# Patient Record
Sex: Male | Born: 1975 | Race: White | Hispanic: No | Marital: Single
Health system: Midwestern US, Community
[De-identification: ages and names within clinical notes are randomized; demographics above are authoritative.]

## PROBLEM LIST (undated history)

## (undated) ENCOUNTER — Emergency Department (HOSPITAL_COMMUNITY): Payer: Self-pay

## (undated) DIAGNOSIS — F1910 Other psychoactive substance abuse, uncomplicated: Secondary | ICD-10-CM

## (undated) DIAGNOSIS — F1010 Alcohol abuse, uncomplicated: Secondary | ICD-10-CM

## (undated) DIAGNOSIS — F32A Depression, unspecified: Secondary | ICD-10-CM

## (undated) DIAGNOSIS — E119 Type 2 diabetes mellitus without complications: Secondary | ICD-10-CM

## (undated) DIAGNOSIS — E109 Type 1 diabetes mellitus without complications: Secondary | ICD-10-CM

## (undated) DIAGNOSIS — F329 Major depressive disorder, single episode, unspecified: Secondary | ICD-10-CM

## (undated) HISTORY — PX: CLAVICLE SURGERY: SHX598

---

## 2010-04-02 MED ORDER — TRIMETHOPRIM-SULFAMETHOXAZOLE 160 MG-800 MG TAB
160-800 mg | ORAL_TABLET | Freq: Two times a day (BID) | ORAL | Status: AC
Start: 2010-04-02 — End: 2010-04-16

## 2010-04-02 MED ORDER — TRAMADOL 50 MG TAB
50 mg | ORAL_TABLET | Freq: Four times a day (QID) | ORAL | Status: DC | PRN
Start: 2010-04-02 — End: 2010-05-14

## 2010-04-02 MED ORDER — CHLORHEXIDINE GLUCONATE 4 % TOPICAL LIQUID
4 % | Freq: Two times a day (BID) | CUTANEOUS | Status: AC | PRN
Start: 2010-04-02 — End: 2010-04-16

## 2010-04-02 NOTE — ED Notes (Signed)
I have reviewed discharge instructions with the patient.  The patient verbalized understanding.      Pt given RX for following medications at discharge   New prescriptions   Medication Sig Dispense Refill   ??? insulin glargine (LANTUS) 100 unit/mL injection 35 Units by SubCUTAneous route nightly.         ??? insulin aspart (NOVOLOG) 100 unit/mL injection by SubCUTAneous route Before breakfast, lunch, and dinner. Sliding scale        ??? trimethoprim-sulfamethoxazole (BACTRIM DS) 160-800 mg per tablet Take 1 Tab by mouth two (2) times a day for 14 days.  28 Tab  0   ??? chlorhexidine (HIBICLENS) 4 % liquid Apply 1 mL to affected area two (2) times daily as needed for 14 days.  480 mL  0   ??? traMADol (ULTRAM) 50 mg tablet Take 1 Tab by mouth every six (6) hours as needed for Pain.  30 Tab  0   .  Pt discharged ambulatory from emergency department in no acute distress.

## 2010-04-02 NOTE — ED Provider Notes (Signed)
HPI Comments: Pt is here today with multiple small abscesses. He is staying at a rehab facility [facility] [address] and thinks that the bunk he is sleeping in has been contaminated with MRSA. He states he has had staph infection before though. He is not having a fever and does have a little bit of pain in those area's. They are draining a little.    Patient is a 35 y.o. male presenting with abscess. The history is provided by the patient.   Abscess   This is a new problem. The problem has been gradually worsening. Associated with: pt staying in a rehab facility. There has been no fever. Affected Location: left side of neck, bilateral axilla's, scalp. The pain is at a severity of 5/10. The pain is moderate. The pain has been constant since onset. Associated symptoms include pain. Pertinent negatives include no blisters, no itching, no weeping and no hives. Risk factors include new environmental exposures. He has tried nothing for the symptoms.        Past Medical History   Diagnosis Date   ??? Diabetes    ??? Other ill-defined conditions      45 broken bones        Past Surgical History   Procedure Date   ??? Hx orthopaedic      left ankle, left foot, right knee, 4 fingers right hand, both sides clavical   ??? Hx heent      eye         No family history on file.     History     Social History   ??? Marital Status: Single     Spouse Name: N/A     Number of Children: N/A   ??? Years of Education: N/A     Occupational History   ??? Not on file.     Social History Main Topics   ??? Smoking status: Former Smoker     Quit date: 02/01/2010   ??? Smokeless tobacco: Not on file   ??? Alcohol Use: No   ??? Drug Use: No   ??? Sexually Active:      Other Topics Concern   ??? Not on file     Social History Narrative   ??? No narrative on file                  ALLERGIES: Review of patient's allergies indicates no known allergies.      Review of Systems   Constitutional: Negative.    HENT: Negative.    Eyes: Negative.    Respiratory: Negative.     Cardiovascular: Negative.    Gastrointestinal: Negative.    Genitourinary: Negative.    Musculoskeletal: Negative.    Skin: Positive for wound. Negative for itching.   Neurological: Negative.    Hematological: Negative.    Psychiatric/Behavioral: Negative.    [all other systems reviewed and are negative        Filed Vitals:    04/02/10 1453   BP: 144/84   Pulse: 86   Temp: 98.4 ??F (36.9 ??C)   Resp: 18   Height: 5\' 7"  (1.702 m)   Weight: 175 lb (79.379 kg)   SpO2: 99%            Physical Exam   [nursing notereviewed.  Constitutional: He is oriented to person, place, and time. He appears well-developed and well-nourished.   HENT:   Head: Normocephalic and atraumatic.   Right Ear: External ear normal.   Left Ear:  External ear normal.   Nose: Nose normal.   Mouth/Throat: Oropharynx is clear and moist.   Eyes: Conjunctivae and EOM are normal. Pupils are equal, round, and reactive to light.   Neck: Normal range of motion. Neck supple.   Cardiovascular: Normal rate, regular rhythm, normal heart sounds and intact distal pulses.    Pulmonary/Chest: Effort normal and breath sounds normal.   Abdominal: Soft. Bowel sounds are normal.   Musculoskeletal: Normal range of motion.   Neurological: He is alert and oriented to person, place, and time. He has normal reflexes.   Skin: Skin is warm and dry. There is erythema.        Psychiatric: He has a normal mood and affect. His behavior is normal. Judgment and thought content normal.        MDM    Procedures    The patient was observed in the ED.    I discussed the results of all labs, procedures, radiographs, and treatments with the patient and available family.  Treatment plan is agreed upon and the patient is ready for discharge.  All voiced understanding of the discharge plan and medication instructions or changes as appropriate.  Questions about treatment in the ED were answered.  All were encouraged to return should symptoms worsen or new problems develop.

## 2010-05-14 NOTE — ED Notes (Signed)
Pt with multiple abscesses to face and neck states he has been on antibiotic for one month but is developing new wounds.

## 2010-05-14 NOTE — ED Provider Notes (Signed)
HPI Comments: Pt with recurrent abscesses to face.    Patient is a 35 y.o. male presenting with skin problem. The history is provided by the patient.   Skin Problem   This is a recurrent problem. The current episode started yesterday. The problem has been gradually worsening. Patient reports a subjective fever - was not measured.The fever has been present for less than 1 day. The rash is present on the face. The pain is moderate. The pain has been constant since onset. Associated symptoms include pain. Pertinent negatives include no weeping. Treatments tried: on bactrim. The treatment provided no relief.        Past Medical History   Diagnosis Date   ??? Diabetes    ??? Other ill-defined conditions      45 broken bones        Past Surgical History   Procedure Date   ??? Hx orthopaedic      left ankle, left foot, right knee, 4 fingers right hand, both sides clavical   ??? Hx heent      eye         No family history on file.     History     Social History   ??? Marital Status: Single     Spouse Name: N/A     Number of Children: N/A   ??? Years of Education: N/A     Occupational History   ??? Not on file.     Social History Main Topics   ??? Smoking status: Former Smoker     Quit date: 02/01/2010   ??? Smokeless tobacco: Not on file   ??? Alcohol Use: No   ??? Drug Use: No   ??? Sexually Active:      Other Topics Concern   ??? Not on file     Social History Narrative   ??? No narrative on file                  ALLERGIES: Review of patient's allergies indicates no known allergies.      Review of Systems   Constitutional: Positive for fever.   HENT: Positive for facial swelling.    Respiratory: Negative.    Cardiovascular: Negative.    Gastrointestinal: Negative.    Skin: Positive for rash.       Filed Vitals:    05/14/10 1817   BP: 139/82   Pulse: 79   Temp: 97.8 ??F (36.6 ??C)   Resp: 18   Height: 5\' 9"  (1.753 m)   Weight: 185 lb (83.915 kg)   SpO2: 99%            Physical Exam   Constitutional: He appears well-developed and well-nourished.    HENT:   Head: Normocephalic.   Neck: Normal range of motion. Neck supple.   Skin: Skin is warm and dry. Lesion noted.             MDM     Differential Diagnosis; Clinical Impression; Plan:     [Multiple abscess.  Pt with nl BS at home.  Nl VS.  I&D of 2 abscesses done.  Pt gave verbal consent.  Told there would be a scar.  Tolerated well.  Dx Abscess  Plan clindamycin  Amount and/or Complexity of Data Reviewed:    Review and summarize past medical records:  Yes  Risk of Significant Complications, Morbidity, and/or Mortality:   Presenting problems:  [moderate  Diagnostic procedures:  [moderate  Management options:  [employer]  I&D Abcess Simple  Consent: Verbal consent obtained.  Consent given by: patient  Date/Time: 05/14/2010 10:43 PM  Performed by: attendingPreparation: skin prepped with Betadine  Pre-procedure re-eval: Immediately prior to the procedure, the patient was reevaluated and found suitable for the planned procedure and any planned medications.  Location details: face  Anesthesia: local infiltration  Local anesthetic: lidocaine 1% with epinephrine  Anesthetic total: 3 ml  Scalpel size: 11  Incision type: single straight  Complexity: simple  Drainage: purulent  Drainage amount: moderate  Wound treatment: wound left open and drain placed  Packing material: 1/4 in iodoform gauze  Post-procedure: dressing applied  Patient tolerance: Patient tolerated the procedure well with no immediate complications.  My total time at bedside, performing this procedure was 1-15 minutes.

## 2010-05-15 MED ORDER — CLINDAMYCIN 300 MG CAP
300 mg | ORAL_CAPSULE | Freq: Four times a day (QID) | ORAL | Status: AC
Start: 2010-05-15 — End: 2010-05-21

## 2010-05-15 MED ORDER — NAPROXEN 500 MG TAB
500 mg | ORAL_TABLET | Freq: Two times a day (BID) | ORAL | Status: AC
Start: 2010-05-15 — End: ?

## 2010-05-15 MED ORDER — LIDOCAINE-EPINEPHRINE 1 %-1:100,000 IJ SOLN
1 %-:00,000 | INTRAMUSCULAR | Status: DC
Start: 2010-05-15 — End: 2010-05-15
  Administered 2010-05-15: 03:00:00 via INTRADERMAL

## 2017-11-16 ENCOUNTER — Other Ambulatory Visit: Payer: Self-pay

## 2017-11-16 ENCOUNTER — Inpatient Hospital Stay (HOSPITAL_COMMUNITY)
Admission: EM | Admit: 2017-11-16 | Discharge: 2017-11-18 | DRG: 638 | Disposition: A | Discharge status: Home / Self Care | Payer: Self-pay | Attending: Internal Medicine | Admitting: Internal Medicine

## 2017-11-16 ENCOUNTER — Encounter (HOSPITAL_COMMUNITY): Payer: Self-pay

## 2017-11-16 ENCOUNTER — Emergency Department (HOSPITAL_COMMUNITY): Payer: Self-pay

## 2017-11-16 DIAGNOSIS — E1110 Type 2 diabetes mellitus with ketoacidosis without coma: Principal | ICD-10-CM

## 2017-11-16 DIAGNOSIS — Z794 Long term (current) use of insulin: Secondary | ICD-10-CM

## 2017-11-16 DIAGNOSIS — B349 Viral infection, unspecified: Secondary | ICD-10-CM | POA: Diagnosis present

## 2017-11-16 DIAGNOSIS — E785 Hyperlipidemia, unspecified: Secondary | ICD-10-CM | POA: Diagnosis present

## 2017-11-16 DIAGNOSIS — E1169 Type 2 diabetes mellitus with other specified complication: Secondary | ICD-10-CM | POA: Diagnosis present

## 2017-11-16 DIAGNOSIS — N289 Disorder of kidney and ureter, unspecified: Secondary | ICD-10-CM

## 2017-11-16 DIAGNOSIS — I10 Essential (primary) hypertension: Secondary | ICD-10-CM | POA: Diagnosis present

## 2017-11-16 DIAGNOSIS — M549 Dorsalgia, unspecified: Secondary | ICD-10-CM | POA: Diagnosis present

## 2017-11-16 DIAGNOSIS — F99 Mental disorder, not otherwise specified: Secondary | ICD-10-CM

## 2017-11-16 DIAGNOSIS — R05 Cough: Secondary | ICD-10-CM | POA: Diagnosis present

## 2017-11-16 DIAGNOSIS — E875 Hyperkalemia: Secondary | ICD-10-CM

## 2017-11-16 DIAGNOSIS — E1310 Other specified diabetes mellitus with ketoacidosis without coma: Secondary | ICD-10-CM

## 2017-11-16 DIAGNOSIS — E876 Hypokalemia: Secondary | ICD-10-CM | POA: Diagnosis present

## 2017-11-16 DIAGNOSIS — R059 Cough, unspecified: Secondary | ICD-10-CM | POA: Diagnosis present

## 2017-11-16 DIAGNOSIS — N179 Acute kidney failure, unspecified: Secondary | ICD-10-CM | POA: Diagnosis present

## 2017-11-16 DIAGNOSIS — Z23 Encounter for immunization: Secondary | ICD-10-CM

## 2017-11-16 DIAGNOSIS — E871 Hypo-osmolality and hyponatremia: Secondary | ICD-10-CM | POA: Diagnosis present

## 2017-11-16 HISTORY — DX: Type 2 diabetes mellitus without complications: E11.9

## 2017-11-16 LAB — CBC
HCT: 33.4 % — ABNORMAL LOW (ref 39.0–52.0)
Hemoglobin: 10.8 g/dL — ABNORMAL LOW (ref 13.0–17.0)
MCH: 26.9 pg (ref 26.0–34.0)
MCHC: 32.3 g/dL (ref 30.0–36.0)
MCV: 83.3 fL (ref 80.0–100.0)
NRBC: 0 % (ref 0.0–0.2)
PLATELETS: 538 10*3/uL — AB (ref 150–400)
RBC: 4.01 MIL/uL — ABNORMAL LOW (ref 4.22–5.81)
RDW: 14.2 % (ref 11.5–15.5)
WBC: 13.3 10*3/uL — ABNORMAL HIGH (ref 4.0–10.5)

## 2017-11-16 LAB — TROPONIN I: Troponin I: <0.03 ng/mL (ref <0.03)

## 2017-11-16 LAB — COMPREHENSIVE METABOLIC PANEL
ALT: 21 U/L (ref 0–44)
AST: 22 U/L (ref 15–41)
Albumin: 3.6 g/dL (ref 3.5–5.0)
Alkaline Phosphatase: 138 U/L — ABNORMAL HIGH (ref 38–126)
Anion gap: 19 — ABNORMAL HIGH (ref 5–15)
BUN: 39 mg/dL — ABNORMAL HIGH (ref 6–20)
CALCIUM: 8.9 mg/dL (ref 8.9–10.3)
CO2: 19 mmol/L — AB (ref 22–32)
Chloride: 81 mmol/L — ABNORMAL LOW (ref 98–111)
Creatinine, Ser: 1.46 mg/dL — ABNORMAL HIGH (ref 0.61–1.24)
GFR calc non Af Amer: 57 mL/min — ABNORMAL LOW (ref >60)
GLUCOSE: 911 mg/dL — AB (ref 70–99)
Potassium: 6.9 mmol/L (ref 3.5–5.1)
SODIUM: 119 mmol/L — AB (ref 135–145)
Total Bilirubin: 1.1 mg/dL (ref 0.3–1.2)
Total Protein: 7.7 g/dL (ref 6.5–8.1)

## 2017-11-16 LAB — URINALYSIS, ROUTINE W REFLEX MICROSCOPIC
BACTERIA UA: NONE SEEN
BILIRUBIN URINE: NEGATIVE
HGB URINE DIPSTICK: NEGATIVE
Ketones, ur: 20 mg/dL — AB
Leukocytes, UA: NEGATIVE
Nitrite: NEGATIVE
PROTEIN: NEGATIVE mg/dL
SPECIFIC GRAVITY, URINE: 1.02 (ref 1.005–1.030)
pH: 6 (ref 5.0–8.0)

## 2017-11-16 LAB — BLOOD GAS, VENOUS
Acid-base deficit: 3.1 mmol/L — ABNORMAL HIGH (ref 0.0–2.0)
BICARBONATE: 19.6 mmol/L — AB (ref 20.0–28.0)
FIO2: 21
O2 Saturation: 66 %
PH VEN: 7.451 — AB (ref 7.250–7.430)
PO2 VEN: 35.6 mmHg (ref 32.0–45.0)
Patient temperature: 98.6
pCO2, Ven: 28.5 mmHg — ABNORMAL LOW (ref 44.0–60.0)

## 2017-11-16 LAB — LIPASE, BLOOD: Lipase: 17 U/L (ref 11–51)

## 2017-11-16 LAB — CBG MONITORING, ED: Glucose-Capillary: >600 mg/dL (ref 70–99)

## 2017-11-16 MED ORDER — FOLIC ACID 1 MG PO TABS
1.0000 mg | ORAL_TABLET | Freq: Every day | ORAL | Status: DC
  Administered 2017-11-17 – 2017-11-18 (×2): 1 mg via ORAL
  Filled 2017-11-16 (×2): qty 1

## 2017-11-16 MED ORDER — TRAZODONE HCL 100 MG PO TABS
200.0000 mg | ORAL_TABLET | Freq: Every day | ORAL | Status: DC
  Administered 2017-11-16 – 2017-11-17 (×2): 200 mg via ORAL
  Filled 2017-11-16 (×2): qty 2

## 2017-11-16 MED ORDER — SODIUM CHLORIDE 0.9 % IV SOLN
INTRAVENOUS | Status: DC
  Administered 2017-11-16 – 2017-11-17 (×2): via INTRAVENOUS

## 2017-11-16 MED ORDER — SODIUM CHLORIDE 0.9 % IV BOLUS
1000.0000 mL | Freq: Once | INTRAVENOUS | Status: AC
  Administered 2017-11-16: 1000 mL via INTRAVENOUS

## 2017-11-16 MED ORDER — GUAIFENESIN-DM 100-10 MG/5ML PO SYRP
5.0000 mL | ORAL_SOLUTION | ORAL | Status: DC | PRN
  Administered 2017-11-16 – 2017-11-18 (×7): 5 mL via ORAL
  Filled 2017-11-16 (×7): qty 10

## 2017-11-16 MED ORDER — DEXTROSE-NACL 5-0.45 % IV SOLN
INTRAVENOUS | Status: DC
  Administered 2017-11-17: 05:00:00 via INTRAVENOUS

## 2017-11-16 MED ORDER — RISPERIDONE 1 MG PO TABS
3.0000 mg | ORAL_TABLET | Freq: Two times a day (BID) | ORAL | Status: DC
  Administered 2017-11-16 – 2017-11-18 (×4): 3 mg via ORAL
  Filled 2017-11-16 (×2): qty 1
  Filled 2017-11-16: qty 3
  Filled 2017-11-16: qty 1

## 2017-11-16 MED ORDER — SODIUM CHLORIDE 0.9 % IV SOLN
INTRAVENOUS | Status: AC
  Administered 2017-11-16: via INTRAVENOUS

## 2017-11-16 MED ORDER — INSULIN REGULAR(HUMAN) IN NACL 100-0.9 UT/100ML-% IV SOLN
INTRAVENOUS | Status: DC
  Administered 2017-11-16: 5.4 [IU] via INTRAVENOUS

## 2017-11-16 MED ORDER — GABAPENTIN 300 MG PO CAPS
300.0000 mg | ORAL_CAPSULE | Freq: Two times a day (BID) | ORAL | Status: DC
  Administered 2017-11-16 – 2017-11-18 (×4): 300 mg via ORAL
  Filled 2017-11-16 (×4): qty 1

## 2017-11-16 MED ORDER — SODIUM CHLORIDE 0.9 % IV SOLN: INTRAVENOUS | Status: DC

## 2017-11-16 MED ORDER — INSULIN REGULAR(HUMAN) IN NACL 100-0.9 UT/100ML-% IV SOLN
INTRAVENOUS | Status: DC
  Filled 2017-11-16: qty 100

## 2017-11-16 MED ORDER — PRAVASTATIN SODIUM 20 MG PO TABS
40.0000 mg | ORAL_TABLET | Freq: Every day | ORAL | Status: DC
  Administered 2017-11-16 – 2017-11-17 (×2): 40 mg via ORAL
  Filled 2017-11-16 (×2): qty 2

## 2017-11-16 MED ORDER — TRAMADOL HCL 50 MG PO TABS
50.0000 mg | ORAL_TABLET | Freq: Four times a day (QID) | ORAL | Status: DC | PRN
  Administered 2017-11-17 – 2017-11-18 (×4): 50 mg via ORAL
  Filled 2017-11-16 (×4): qty 1

## 2017-11-16 MED ORDER — PNEUMOCOCCAL VAC POLYVALENT 25 MCG/0.5ML IJ INJ
0.5000 mL | INJECTION | INTRAMUSCULAR | Status: AC
  Administered 2017-11-17: 0.5 mL via INTRAMUSCULAR
  Filled 2017-11-16: qty 0.5

## 2017-11-16 MED ORDER — SODIUM CHLORIDE 0.9 % IV BOLUS: 1000.0000 mL | Freq: Once | INTRAVENOUS | Status: DC

## 2017-11-16 MED ORDER — ONDANSETRON HCL 4 MG/2ML IJ SOLN
4.0000 mg | Freq: Four times a day (QID) | INTRAMUSCULAR | Status: DC | PRN
  Administered 2017-11-16: 4 mg via INTRAVENOUS
  Filled 2017-11-16: qty 2

## 2017-11-16 MED ORDER — BENZTROPINE MESYLATE 0.5 MG PO TABS
1.0000 mg | ORAL_TABLET | Freq: Two times a day (BID) | ORAL | Status: DC
  Administered 2017-11-16 – 2017-11-18 (×4): 1 mg via ORAL
  Filled 2017-11-16: qty 1
  Filled 2017-11-16: qty 2
  Filled 2017-11-16 (×2): qty 1
  Filled 2017-11-16: qty 2

## 2017-11-16 NOTE — H&P (Signed)
History and Physical   [facility] [place] [facility] Admission History and Physical [employer], D.O.    Patient Name: Connor Bailey MR#: [id number] Date of Birth: 1975-11-15 Date of Admission: 11/16/2017  Referring MD/NP/PA: Dr. Rosalia Hammers Primary Care Physician: Patient, No Pcp Per  Chief Complaint:  Chief Complaint  Patient presents with  . Hyperglycemia  . Emesis    HPI: Connor Bailey is a 42 y.o. male with a known history of IDDM presents to the emergency department for evaluation of flulike symptoms.  Patient was in a usual state of health until proximally 1 week ago when he developed symptoms of upper respiratory infection such as nasal congestion and cough, sneezing which was nonproductive.  He reports several of his new roommates have similar symptoms.  He noted his blood sugars to be elevated in the 400 - 600 range over the past 2 days.  Patient denies weakness, dizziness, chest pain, shortness of breath, N/V/C/D, abdominal pain, dysuria/frequency, changes in mental status.    Otherwise there has been no change in status. Patient has been taking medication as prescribed and there has been no recent change in medication or diet.  No recent antibiotics.  There has been no recent illness, hospitalizations, travel..    Of note patient moved to this area from [place] approximately 1 week ago and has not yet established medical care.  EMS/ED Course: Patient received insulin via glucose stabilizer, normal saline. Medical admission has been requested for further management of DKA.  Review of Systems:  CONSTITUTIONAL: No fever/chills, fatigue, weakness, weight gain/loss, headache. EYES: No blurry or double vision. ENT: No tinnitus, postnasal drip, redness or soreness of the oropharynx. Positive sneezing, congestion. RESPIRATORY: Positive cough, No dyspnea, wheeze.  No hemoptysis.  CARDIOVASCULAR: No chest pain, palpitations, syncope, orthopnea. No lower extremity  edema.  GASTROINTESTINAL: No nausea, vomiting, abdominal pain, diarrhea, constipation.  No hematemesis, melena or hematochezia. GENITOURINARY: No dysuria, frequency, hematuria. ENDOCRINE: No polyuria or nocturia. No heat or cold intolerance. HEMATOLOGY: No anemia, bruising, bleeding. INTEGUMENTARY: No rashes, ulcers, lesions. MUSCULOSKELETAL: No arthritis, gout, dyspnea. NEUROLOGIC: No numbness, tingling, ataxia, seizure-type activity, weakness. PSYCHIATRIC: No anxiety, depression, insomnia.   Past Medical History:  Diagnosis Date  . Diabetes mellitus without complication (HCC)     History reviewed. No pertinent surgical history.   reports that he has never smoked. He has never used smokeless tobacco. He reports that he does not drink alcohol or use drugs.  No Known Allergies  History reviewed. No pertinent family history.  Prior to Admission medications   Medication Sig Start Date End Date Taking? Authorizing Provider  benztropine (COGENTIN) 1 MG tablet Take 1 mg by mouth 2 (two) times daily.  11/14/17  Yes [provider]  folic acid (FOLVITE) 1 MG tablet Take 1 mg by mouth daily. 10/22/17  Yes [provider]  gabapentin (NEURONTIN) 300 MG capsule Take 300 mg by mouth 2 (two) times daily.  11/14/17  Yes [provider]  insulin aspart (NOVOLOG) 100 UNIT/ML injection Inject 0-10 Units into the skin 3 (three) times daily before meals.   Yes [provider]  insulin glargine (LANTUS) 100 UNIT/ML injection Inject 39 Units into the skin at bedtime.   Yes [provider]  pravastatin (PRAVACHOL) 40 MG tablet Take 40 mg by mouth at bedtime.  11/14/17  Yes [provider]  risperiDONE (RISPERDAL) 3 MG tablet Take 3 mg by mouth 2 (two) times daily.  11/14/17  Yes [provider]  traMADol (ULTRAM) 50 MG tablet Take 50 mg by mouth every 6 (six) hours as needed for moderate pain or severe pain.  10/18/17  Yes [provider]  traZODone (DESYREL) 100 MG tablet Take 200 mg by mouth at bedtime.  11/14/17  Yes [provider]    Physical Exam: Vitals:   11/16/17 1852 11/16/17 1939 11/16/17 2109 11/16/17 2155  BP: (!) 160/72 (!) 161/76 (!) 155/66 (!) 172/99  Pulse: 83 82 78 79  Resp: 18 17 17  (!) 25  Temp: 97.8 F (36.6 C) 99.8 F (37.7 C)    TempSrc: Oral Rectal    SpO2: 95% 93% 96% 91%    GENERAL: 42 y.o.-year-old male patient, well-developed, well-nourished lying in the bed in no acute distress.  Pleasant and cooperative.   HEENT: Head atraumatic, normocephalic. Pupils equal. Mucus membranes moist.  Deformity of the cartilage of the right ear.  NECK: Supple. No JVD. CHEST: Normal breath sounds bilaterally. No wheezing, rales, rhonchi or crackles. No use of accessory muscles of respiration.  No reproducible chest wall tenderness.  CARDIOVASCULAR: S1, S2 normal. No murmurs, rubs, or gallops. Cap refill <2 seconds. Pulses intact distally.  ABDOMEN: Soft, nondistended, nontender. No rebound, guarding, rigidity. Normoactive bowel sounds present in all four quadrants.  EXTREMITIES: No pedal edema, cyanosis, or clubbing. No calf tenderness or Homan's sign.  NEUROLOGIC: The patient is alert and oriented x 3. Cranial nerves II through XII are grossly intact with no focal sensorimotor deficit. PSYCHIATRIC:  Normal affect, mood, thought content. SKIN: Warm, dry, and intact without obvious rash, lesion, or ulcer.    Labs on Admission:  CBC: Recent Labs  Lab 11/16/17 1858  WBC 13.3*  HGB 10.8*  HCT 33.4*  MCV 83.3  PLT 538*   Basic Metabolic Panel: Recent Labs  Lab 11/16/17 1858  NA 119*  K 6.9*  CL 81*  CO2 19*  GLUCOSE 911*  BUN 39*  CREATININE 1.46*  CALCIUM 8.9   GFR: CrCl cannot be calculated (Unknown ideal weight.). Liver Function Tests: Recent Labs  Lab 11/16/17 1858  AST 22  ALT 21  ALKPHOS 138*  BILITOT 1.1  PROT 7.7  ALBUMIN 3.6   Recent Labs  Lab 11/16/17 1858   LIPASE 17   No results for input(s): AMMONIA in the last 168 hours. Coagulation Profile: No results for input(s): INR, PROTIME in the last 168 hours. Cardiac Enzymes: Recent Labs  Lab 11/16/17 2031  TROPONINI <0.03   BNP (last 3 results) No results for input(s): PROBNP in the last 8760 hours. HbA1C: No results for input(s): HGBA1C in the last 72 hours. CBG: Recent Labs  Lab 11/16/17 1858  GLUCAP >600*   Lipid Profile: No results for input(s): CHOL, HDL, LDLCALC, TRIG, CHOLHDL, LDLDIRECT in the last 72 hours. Thyroid Function Tests: No results for input(s): TSH, T4TOTAL, FREET4, T3FREE, THYROIDAB in the last 72 hours. Anemia Panel: No results for input(s): VITAMINB12, FOLATE, FERRITIN, TIBC, IRON, RETICCTPCT in the last 72 hours. Urine analysis:    Component Value Date/Time   COLORURINE STRAW (A) 11/16/2017 1937   APPEARANCEUR CLEAR 11/16/2017 1937   LABSPEC 1.020 11/16/2017 1937   PHURINE 6.0 11/16/2017 1937   GLUCOSEU >=500 (A) 11/16/2017 1937   HGBUR NEGATIVE 11/16/2017 1937   BILIRUBINUR NEGATIVE 11/16/2017 1937   KETONESUR 20 (A) 11/16/2017 1937   PROTEINUR NEGATIVE 11/16/2017 1937   NITRITE NEGATIVE 11/16/2017 1937   LEUKOCYTESUR NEGATIVE 11/16/2017 1937   Sepsis Labs: @LABRCNTIP (procalcitonin:4,lacticidven:4) )No results found for this or  any previous visit (from the past 240 hour(s)).   Radiological Exams on Admission: Dg Chest Port 1 View  Result Date: 11/16/2017 CLINICAL DATA:  Cough EXAM: PORTABLE CHEST 1 VIEW COMPARISON:  None. FINDINGS: Heart is borderline in size. Mild peribronchial thickening. No confluent opacities, effusions or edema. No acute bony abnormality. IMPRESSION: Heart upper limits normal in size.  Mild bronchitic changes. Electronically Signed   By: Charlett Nose M.D.   On: 11/16/2017 20:33    EKG: Normal sinus rhythm at 84 bpm with normal axis and nonspecific ST-T wave changes.   Assessment/Plan  This is a 42 y.o. male with a  history of insulin dependent diabetes now being admitted with:  #. #. Diabetic ketoacidosis with associated electrolyte abnormalities - hyponatremia and hyperkalemia -Admit to stepdown -IV insulin drip  -IV fluid hydration -NPO -BMP every 4 hours  -Check beta-hydroxybutyrate, VBG. -Replace electrolytes as needed  -Follow up cultures -Check fluswab   Admission status: Inpatient stepdown IV Fluids: NS Diet/Nutrition: NPO Consults called: None  DVT Px: SCDs and early ambulation. Code Status: Full Code  Disposition Plan: To home in 1-2 days  All the records are reviewed and case discussed with ED provider. Management plans discussed with the patient and/or family who express understanding and agree with plan of care.  Charmaine Placido D.O. on 11/16/2017 at 10:30 PM CC: Primary care physician; Patient, No Pcp Per   11/16/2017, 10:30 PM

## 2017-11-16 NOTE — ED Triage Notes (Signed)
Pt reports that he thinks that he has had the flu for the last 3 days. Endorses generalized body aches, nausea, vomiting, diarrhea. He is shaking and unable to sit still. Denies every using drugs or alcohol. Reports a a hx of diabetes.

## 2017-11-16 NOTE — ED Provider Notes (Addendum)
[facility]-EMERGENCY DEPT Provider Note   CSN: [id number] Arrival date & time: 11/16/17  1840     History   Chief Complaint Chief Complaint  Patient presents with  . Hyperglycemia  . Emesis    HPI Connor Bailey is a 42 y.o. male.  HPI 42 year old male with insulin-dependent diabetes presents today complaining of high blood sugar and flulike symptoms for the past week.  He states he began having flulike symptoms 1 week ago with nasal congestion and cough.  Had temp to 99 degrees at home.  He states that his blood sugar was 400 yesterday and greater than 600 today.  He has been taking his usual insulin per report.  He denies headache, head injury, neck pain, chest pain, vomiting, or diarrhea.  He reports that he is new to this area moving from [place] approximately 1 week ago.  He states he is living with friends here in [place].  He denies any urinary tract infection symptoms. Past Medical History:  Diagnosis Date  . Diabetes mellitus without complication (HCC)     There are no active problems to display for this patient.   History reviewed. No pertinent surgical history.      Home Medications    Prior to Admission medications   Not on File    Family History History reviewed. No pertinent family history.  Social History Social History   Tobacco Use  . Smoking status: Never Smoker  . Smokeless tobacco: Never Used  Substance Use Topics  . Alcohol use: Never    Frequency: Never  . Drug use: Never     Allergies   Patient has no known allergies.   Review of Systems Review of Systems  All other systems reviewed and are negative.    Physical Exam Updated Vital Signs BP (!) 160/72 (BP Location: Right Arm)   Pulse 83   Temp 97.8 F (36.6 C) (Oral)   Resp 18   SpO2 95%   Physical Exam  Constitutional: He is oriented to person, place, and time. He appears well-developed and well-nourished.  HENT:  Head: Normocephalic and  atraumatic.  Right Ear: External ear normal.  Left Ear: External ear normal.  Nose: Nose normal.  Mouth/Throat: Oropharynx is clear and moist.  Eyes: Pupils are equal, round, and reactive to light.  Neck: Normal range of motion.  Cardiovascular: Normal rate and regular rhythm.  Pulmonary/Chest: He has wheezes.  Diffuse rhonchi with some expiratory wheezes  Abdominal: Soft. Bowel sounds are normal.  Musculoskeletal: Normal range of motion.  Neurological: He is alert and oriented to person, place, and time.  Skin: Skin is warm and dry. Capillary refill takes less than 2 seconds.  Psychiatric: He has a normal mood and affect.  Nursing note and vitals reviewed.    ED Treatments / Results  Labs (all labs ordered are listed, but only abnormal results are displayed) Labs Reviewed  COMPREHENSIVE METABOLIC PANEL - Abnormal; Notable for the following components:      Result Value   Sodium 119 (*)    Potassium 6.9 (*)    Chloride 81 (*)    CO2 19 (*)    Glucose, Bld 911 (*)    BUN 39 (*)    Creatinine, Ser 1.46 (*)    Alkaline Phosphatase 138 (*)    GFR calc non Af Amer 57 (*)    Anion gap 19 (*)    All other components within normal limits  CBC - Abnormal; Notable for the  following components:   WBC 13.3 (*)    RBC 4.01 (*)    Hemoglobin 10.8 (*)    HCT 33.4 (*)    Platelets 538 (*)    All other components within normal limits  URINALYSIS, ROUTINE W REFLEX MICROSCOPIC - Abnormal; Notable for the following components:   Color, Urine STRAW (*)    Glucose, UA >=500 (*)    Ketones, ur 20 (*)    All other components within normal limits  BLOOD GAS, VENOUS - Abnormal; Notable for the following components:   pH, Ven 7.451 (*)    pCO2, Ven 28.5 (*)    Bicarbonate 19.6 (*)    Acid-base deficit 3.1 (*)    All other components within normal limits  CBG MONITORING, ED - Abnormal; Notable for the following components:   Glucose-Capillary >600 (*)    All other components within normal  limits  CBG MONITORING, ED - Abnormal; Notable for the following components:   Glucose-Capillary >600 (*)    All other components within normal limits  LIPASE, BLOOD  TROPONIN I    EKG EKG Interpretation  Date/Time:  Sunday November 16 2017 20:57:29 EDT Ventricular Rate:  74 PR Interval:    QRS Duration: 103 QT Interval:  419 QTC Calculation: 465 R Axis:   84 Text Interpretation:  Normal sinus rhythm Confirmed by Margarita Grizzle [phone]) on 11/16/2017 9:02:12 PM   Radiology Dg Chest Port 1 View  Result Date: 11/16/2017 CLINICAL DATA:  Cough EXAM: PORTABLE CHEST 1 VIEW COMPARISON:  None. FINDINGS: Heart is borderline in size. Mild peribronchial thickening. No confluent opacities, effusions or edema. No acute bony abnormality. IMPRESSION: Heart upper limits normal in size.  Mild bronchitic changes. Electronically Signed   By: Charlett Nose M.D.   On: 11/16/2017 20:33    Procedures Procedures (including critical care time)  Medications Ordered in ED Medications - No data to display   Initial Impression / Assessment and Plan / ED Course  I have reviewed the triage vital signs and the nursing notes.  Pertinent labs & imaging results that were available during my care of the patient were reviewed by me and considered in my medical decision making (see chart for details).    42 yo male with iddm presents today with bs at 900.  Ph normal on vbg but anion gap noted with co2 at 19..  No definite trigger identified.  No fever, nausea, vomiting, acute ekg changes.  IV one liter initiallyinfused,now with labs returned hyperkalemia noted and will continue iv fluids and insulin per glucose stabilizer/ Patient with creatinine at 1.46- baseline unknown  Discussed with Dr. Emmit Pomfret and she will see for admission CRITICAL CARE Performed by: Margarita Grizzle Total critical care time: 60 minutes Critical care time was exclusive of separately billable procedures and treating other  patients. Critical care was necessary to treat or prevent imminent or life-threatening deterioration. Critical care was time spent personally by me on the following activities: development of treatment plan with patient and/or surrogate as well as nursing, discussions with consultants, evaluation of patient's response to treatment, examination of patient, obtaining history from patient or surrogate, ordering and performing treatments and interventions, ordering and review of laboratory studies, ordering and review of radiographic studies, pulse oximetry and re-evaluation of patient's condition.  Final Clinical Impressions(s) / ED Diagnoses   Final diagnoses:  Diabetic ketoacidosis without coma associated with other specified diabetes mellitus (HCC)  Hyperkalemia  Renal insufficiency    ED Discharge Orders  None       Margarita Grizzle, MD 11/16/17 1610    Margarita Grizzle, MD 11/16/17 2232

## 2017-11-16 NOTE — ED Notes (Signed)
ED TO INPATIENT HANDOFF REPORT  Name/Age/Gender Connor Bailey 42 y.o. male  Code Status   Home/SNF/Other Home  Chief Complaint nausea and vomiting; fever  Level of Care/Admitting Diagnosis ED Disposition    ED Disposition Condition Comment   Admit  Hospital Area: [facility] [[id number]]  Level of Care: Stepdown [14]  Admit to SDU based on following criteria: Other see comments  Comments: DKA  Diagnosis: DKA (diabetic ketoacidoses) [facility]) [[id number]]  Admitting Physician: Sherron Monday  Attending Physician: Sherron Monday  Estimated length of stay: past midnight tomorrow  Certification:: I certify this patient will need inpatient services for at least 2 midnights  PT Class (Do Not Modify): Inpatient [101]  PT Acc Code (Do Not Modify): Private [1]       Medical History Past Medical History:  Diagnosis Date  . Diabetes mellitus without complication ([place])     Allergies No Known Allergies  IV Location/Drains/Wounds Patient Lines/Drains/Airways Status   Active Line/Drains/Airways    Name:   Placement date:   Placement time:   Site:   Days:   Peripheral IV 11/16/17 Right;Upper Arm   11/16/17    2019    Arm   less than 1          Labs/Imaging Results for orders placed or performed during the hospital encounter of 11/16/17 (from the past 48 hour(s))  Lipase, blood     Status: None   Collection Time: 11/16/17  6:58 PM  Result Value Ref Range   Lipase 17 11 - 51 U/L    Comment: Performed at [facility], [place] [address], [place], [place] [address]  Comprehensive metabolic panel     Status: Abnormal   Collection Time: 11/16/17  6:58 PM  Result Value Ref Range   Sodium 119 (LL) 135 - 145 mmol/L    Comment: CRITICAL RESULT CALLED TO, READ BACK BY AND VERIFIED WITH: Assunta Found RN 2142 11/16/2017 [place]    Potassium 6.9 (HH) 3.5 - 5.1 mmol/L    Comment: CRITICAL RESULT CALLED TO, READ BACK BY AND VERIFIED  WITH: Assunta Found RN 2142 11/16/2017 [place]    Chloride 81 (L) 98 - 111 mmol/L   CO2 19 (L) 22 - 32 mmol/L   Glucose, Bld 911 (HH) 70 - 99 mg/dL    Comment: CRITICAL RESULT CALLED TO, READ BACK BY AND VERIFIED WITH: Assunta Found RN 2142 11/16/2017 [place]    BUN 39 (H) 6 - 20 mg/dL   Creatinine, Ser 1.46 (H) 0.61 - 1.24 mg/dL   Calcium 8.9 8.9 - 10.3 mg/dL   Total Protein 7.7 6.5 - 8.1 g/dL   Albumin 3.6 3.5 - 5.0 g/dL   AST 22 15 - 41 U/L   ALT 21 0 - 44 U/L   Alkaline Phosphatase 138 (H) 38 - 126 U/L   Total Bilirubin 1.1 0.3 - 1.2 mg/dL   GFR calc non Af Amer 57 (L) >60 mL/min   GFR calc Af Amer >60 >60 mL/min    Comment: (NOTE) The eGFR has been calculated using the CKD EPI equation. This calculation has not been validated in all clinical situations. eGFR's persistently <60 mL/min signify possible Chronic Kidney Disease.    Anion gap 19 (H) 5 - 15    Comment: Performed at [facility], [place] [address] [place], [place] [address]  CBC     Status: Abnormal   Collection Time: 11/16/17  6:58 PM  Result Value Ref  Range   WBC 13.3 (H) 4.0 - 10.5 K/uL   RBC 4.01 (L) 4.22 - 5.81 MIL/uL   Hemoglobin 10.8 (L) 13.0 - 17.0 g/dL   HCT 33.4 (L) 39.0 - 52.0 %   MCV 83.3 80.0 - 100.0 fL   MCH 26.9 26.0 - 34.0 pg   MCHC 32.3 30.0 - 36.0 g/dL   RDW 14.2 11.5 - 15.5 %   Platelets 538 (H) 150 - 400 K/uL   nRBC 0.0 0.0 - 0.2 %    Comment: Performed at [facility], [place] [address] [place], [place] [address]  CBG monitoring, ED     Status: Abnormal   Collection Time: 11/16/17  6:58 PM  Result Value Ref Range   Glucose-Capillary >600 (HH) 70 - 99 mg/dL  Urinalysis, Routine w reflex microscopic     Status: Abnormal   Collection Time: 11/16/17  7:37 PM  Result Value Ref Range   Color, Urine STRAW (A) YELLOW   APPearance CLEAR CLEAR   Specific Gravity, Urine 1.020 1.005 - 1.030   pH 6.0 5.0 - 8.0   Glucose, UA >=500 (A) NEGATIVE mg/dL   Hgb urine dipstick  NEGATIVE NEGATIVE   Bilirubin Urine NEGATIVE NEGATIVE   Ketones, ur 20 (A) NEGATIVE mg/dL   Protein, ur NEGATIVE NEGATIVE mg/dL   Nitrite NEGATIVE NEGATIVE   Leukocytes, UA NEGATIVE NEGATIVE   RBC / HPF 0-5 0 - 5 RBC/hpf   WBC, UA 0-5 0 - 5 WBC/hpf   Bacteria, UA NONE SEEN NONE SEEN    Comment: Performed at [facility], [place] [address] [place], [place] [address]  Blood gas, venous     Status: Abnormal   Collection Time: 11/16/17  8:14 PM  Result Value Ref Range   FIO2 21.00    pH, Ven 7.451 (H) 7.250 - 7.430   pCO2, Ven 28.5 (L) 44.0 - 60.0 mmHg   pO2, Ven 35.6 32.0 - 45.0 mmHg   Bicarbonate 19.6 (L) 20.0 - 28.0 mmol/L   Acid-base deficit 3.1 (H) 0.0 - 2.0 mmol/L   O2 Saturation 66.0 %   Patient temperature 98.6    Collection site VEIN    Drawn by COLLECTED BY NURSE    Sample type VENOUS     Comment: Performed at [place] [address] [place], [place] [address]  Troponin I     Status: None   Collection Time: 11/16/17  8:31 PM  Result Value Ref Range   Troponin I <0.03 <0.03 ng/mL    Comment: Performed at [facility], [place] [address] [place], [place] [address]  POC CBG, ED     Status: Abnormal   Collection Time: 11/16/17 10:31 PM  Result Value Ref Range   Glucose-Capillary >600 (HH) 70 - 99 mg/dL   Dg Chest Port 1 View  Result Date: 11/16/2017 CLINICAL DATA:  Cough EXAM: PORTABLE CHEST 1 VIEW COMPARISON:  None. FINDINGS: Heart is borderline in size. Mild peribronchial thickening. No confluent opacities, effusions or edema. No acute bony abnormality. IMPRESSION: Heart upper limits normal in size.  Mild bronchitic changes. Electronically Signed   By: Rolm Baptise M.D.   On: 11/16/2017 20:33   EKG Interpretation  Date/Time:  Sunday November 16 2017 20:57:29 EDT Ventricular Rate:  74 PR Interval:    QRS Duration: 103 QT Interval:  419 QTC Calculation: 465 R Axis:   84 Text Interpretation:  Normal sinus  rhythm Confirmed by Pattricia Boss [phone]) on 11/16/2017 9:02:12  PM   Pending Labs [employer] (From admission, onward)    Start     Ordered   Signed and Held  HIV antibody (Routine Testing)  Once,   R     Signed and Held   Signed and Corporate treasurer  STAT Now then every 4 hours ,   STAT     Signed and Held   Signed and Held  Beta-hydroxybutyric acid  Once,   R     Signed and Held   Signed and Held  Urinalysis, Routine w reflex microscopic  Once,   R     Signed and Held   Signed and Held  Hemoglobin A1c  Once,   R     Signed and Held   Signed and Held  Culture, blood (routine x 2)  BLOOD CULTURE X 2,   R     Signed and Held   Signed and Held  Magnesium  Once,   R     Signed and Held   Signed and Held  Phosphorus  Once,   R     Signed and Held          Vitals/Pain Today's Vitals   11/16/17 1939 11/16/17 2109 11/16/17 2155 11/16/17 2232  BP: (!) 161/76 (!) 155/66 (!) 172/99 (!) 181/98  Pulse: 82 78 79 82  Resp: 17 17 (!) 25 17  Temp: 99.8 F (37.7 C)     TempSrc: Rectal     SpO2: 93% 96% 91% 100%  PainSc:        Isolation Precautions No active isolations  Medications Medications  insulin regular, human (MYXREDLIN) 100 units/ 100 mL infusion (has no administration in time range)  sodium chloride 0.9 % bolus 1,000 mL (has no administration in time range)    And  0.9 %  sodium chloride infusion (has no administration in time range)  sodium chloride 0.9 % bolus 1,000 mL (0 mLs Intravenous Stopped 11/16/17 2210)    Mobility walks

## 2017-11-17 DIAGNOSIS — B349 Viral infection, unspecified: Secondary | ICD-10-CM

## 2017-11-17 LAB — BASIC METABOLIC PANEL
ANION GAP: 10 (ref 5–15)
ANION GAP: 8 (ref 5–15)
ANION GAP: 9 (ref 5–15)
BUN: 33 mg/dL — ABNORMAL HIGH (ref 6–20)
BUN: 32 mg/dL — ABNORMAL HIGH (ref 6–20)
BUN: 31 mg/dL — ABNORMAL HIGH (ref 6–20)
CHLORIDE: 100 mmol/L (ref 98–111)
CHLORIDE: 102 mmol/L (ref 98–111)
CHLORIDE: 102 mmol/L (ref 98–111)
CO2: 24 mmol/L (ref 22–32)
CO2: 26 mmol/L (ref 22–32)
CO2: 25 mmol/L (ref 22–32)
CREATININE: 1.08 mg/dL (ref 0.61–1.24)
CREATININE: 0.98 mg/dL (ref 0.61–1.24)
Calcium: 8.7 mg/dL — ABNORMAL LOW (ref 8.9–10.3)
Calcium: 8.7 mg/dL — ABNORMAL LOW (ref 8.9–10.3)
Calcium: 8.5 mg/dL — ABNORMAL LOW (ref 8.9–10.3)
Creatinine, Ser: 1.14 mg/dL (ref 0.61–1.24)
GFR calc Af Amer: >60 mL/min (ref >60)
GFR calc non Af Amer: >60 mL/min (ref >60)
GFR calc non Af Amer: >60 mL/min (ref >60)
GFR calc non Af Amer: >60 mL/min (ref >60)
Glucose, Bld: 284 mg/dL — ABNORMAL HIGH (ref 70–99)
Glucose, Bld: 137 mg/dL — ABNORMAL HIGH (ref 70–99)
Glucose, Bld: 141 mg/dL — ABNORMAL HIGH (ref 70–99)
POTASSIUM: 4.1 mmol/L (ref 3.5–5.1)
Potassium: 3.9 mmol/L (ref 3.5–5.1)
Potassium: 4.1 mmol/L (ref 3.5–5.1)
SODIUM: 134 mmol/L — AB (ref 135–145)
SODIUM: 136 mmol/L (ref 135–145)
Sodium: 136 mmol/L (ref 135–145)

## 2017-11-17 LAB — URINALYSIS, ROUTINE W REFLEX MICROSCOPIC
Bacteria, UA: NONE SEEN
Bilirubin Urine: NEGATIVE
Glucose, UA: >=500 mg/dL — AB
HGB URINE DIPSTICK: NEGATIVE
Ketones, ur: 20 mg/dL — AB
Leukocytes, UA: NEGATIVE
NITRITE: NEGATIVE
Protein, ur: 100 mg/dL — AB
SPECIFIC GRAVITY, URINE: 1.02 (ref 1.005–1.030)
pH: 6 (ref 5.0–8.0)

## 2017-11-17 LAB — GLUCOSE, CAPILLARY
GLUCOSE-CAPILLARY: 364 mg/dL — AB (ref 70–99)
GLUCOSE-CAPILLARY: 215 mg/dL — AB (ref 70–99)
GLUCOSE-CAPILLARY: 178 mg/dL — AB (ref 70–99)
GLUCOSE-CAPILLARY: 119 mg/dL — AB (ref 70–99)
GLUCOSE-CAPILLARY: 198 mg/dL — AB (ref 70–99)
GLUCOSE-CAPILLARY: 245 mg/dL — AB (ref 70–99)
GLUCOSE-CAPILLARY: 308 mg/dL — AB (ref 70–99)
Glucose-Capillary: 532 mg/dL (ref 70–99)
Glucose-Capillary: 541 mg/dL (ref 70–99)
Glucose-Capillary: 285 mg/dL — ABNORMAL HIGH (ref 70–99)
Glucose-Capillary: 141 mg/dL — ABNORMAL HIGH (ref 70–99)
Glucose-Capillary: 123 mg/dL — ABNORMAL HIGH (ref 70–99)
Glucose-Capillary: 83 mg/dL (ref 70–99)
Glucose-Capillary: 131 mg/dL — ABNORMAL HIGH (ref 70–99)
Glucose-Capillary: 298 mg/dL — ABNORMAL HIGH (ref 70–99)
Glucose-Capillary: 292 mg/dL — ABNORMAL HIGH (ref 70–99)

## 2017-11-17 LAB — PHOSPHORUS: Phosphorus: 2.7 mg/dL (ref 2.5–4.6)

## 2017-11-17 LAB — BETA-HYDROXYBUTYRIC ACID: BETA-HYDROXYBUTYRIC ACID: 0.51 mmol/L — AB (ref 0.05–0.27)

## 2017-11-17 LAB — INFLUENZA PANEL BY PCR (TYPE A & B)
Influenza A By PCR: NEGATIVE
Influenza B By PCR: NEGATIVE

## 2017-11-17 LAB — MAGNESIUM: MAGNESIUM: 2.2 mg/dL (ref 1.7–2.4)

## 2017-11-17 LAB — HIV ANTIBODY (ROUTINE TESTING W REFLEX): HIV Screen 4th Generation wRfx: NONREACTIVE

## 2017-11-17 LAB — MRSA PCR SCREENING: MRSA by PCR: NEGATIVE

## 2017-11-17 MED ORDER — INSULIN GLARGINE 100 UNIT/ML ~~LOC~~ SOLN
20.0000 [IU] | Freq: Two times a day (BID) | SUBCUTANEOUS | Status: DC
  Filled 2017-11-17: qty 0.2

## 2017-11-17 MED ORDER — INSULIN GLARGINE 100 UNIT/ML ~~LOC~~ SOLN
19.0000 [IU] | Freq: Two times a day (BID) | SUBCUTANEOUS | Status: DC
  Filled 2017-11-17: qty 0.19

## 2017-11-17 MED ORDER — INSULIN ASPART 100 UNIT/ML ~~LOC~~ SOLN
4.0000 [IU] | Freq: Once | SUBCUTANEOUS | Status: AC
  Administered 2017-11-17: 4 [IU] via SUBCUTANEOUS

## 2017-11-17 MED ORDER — INSULIN ASPART 100 UNIT/ML ~~LOC~~ SOLN: 0.0000 [IU] | Freq: Three times a day (TID) | SUBCUTANEOUS | Status: DC

## 2017-11-17 MED ORDER — BENZONATATE 100 MG PO CAPS
100.0000 mg | ORAL_CAPSULE | Freq: Two times a day (BID) | ORAL | Status: DC | PRN
  Administered 2017-11-17 – 2017-11-18 (×2): 100 mg via ORAL
  Filled 2017-11-17 (×4): qty 1

## 2017-11-17 MED ORDER — INSULIN GLARGINE 100 UNIT/ML ~~LOC~~ SOLN
20.0000 [IU] | Freq: Every day | SUBCUTANEOUS | Status: DC
  Administered 2017-11-17: 20 [IU] via SUBCUTANEOUS
  Filled 2017-11-17: qty 0.2

## 2017-11-17 MED ORDER — HYDRALAZINE HCL 20 MG/ML IJ SOLN: 5.0000 mg | Freq: Four times a day (QID) | INTRAMUSCULAR | Status: DC | PRN

## 2017-11-17 MED ORDER — LISINOPRIL 20 MG PO TABS
20.0000 mg | ORAL_TABLET | Freq: Every day | ORAL | Status: DC
  Administered 2017-11-17 – 2017-11-18 (×2): 20 mg via ORAL
  Filled 2017-11-17: qty 1
  Filled 2017-11-17: qty 2

## 2017-11-17 MED ORDER — INSULIN ASPART 100 UNIT/ML ~~LOC~~ SOLN
0.0000 [IU] | Freq: Three times a day (TID) | SUBCUTANEOUS | Status: DC
  Administered 2017-11-17: 11 [IU] via SUBCUTANEOUS
  Administered 2017-11-18: 3 [IU] via SUBCUTANEOUS
  Administered 2017-11-18: 5 [IU] via SUBCUTANEOUS

## 2017-11-17 MED ORDER — INSULIN GLARGINE 100 UNIT/ML ~~LOC~~ SOLN
19.0000 [IU] | Freq: Once | SUBCUTANEOUS | Status: AC
  Administered 2017-11-17: 19 [IU] via SUBCUTANEOUS
  Filled 2017-11-17: qty 0.19

## 2017-11-17 NOTE — Progress Notes (Addendum)
PROGRESS NOTE  Connor Bailey ZOX:[id number] DOB: Feb 11, 1975 DOA: 11/16/2017 PCP: Patient, No Pcp Per  HPI/Brief Narrative  Connor Bailey is a 42 y.o. year old male with medical history significant for type 2 diabetes, hypertension who presented on 11/16/2017 with 1 week of flulike symptoms and was found to have DKA.  Subjective Ready to eat. Reports cough for a week. Recent sick contacts.   Assessment/Plan:  #DKA, improving.  Anion gap closed, s/p reduced home dose of Lantus, discontinue insulin drip and IV fluids 2 hours after.  ADDENDUM: schedule to give additional 19 U of Lantus so that he will have his full home regimen given he is eating and his blood glucose is now 300s.  Continue to monitor BMP and blood glucose levels.  Scheduled short-acting insulin for meals as well as corrective coverage scale.f/u A1c. Will need PCP on discharge  #Hyponatremia and hypokalemia, resolved.  Will secondary to hypoglycemia which is now corrected.  Continue to monitor.  #AKI, resolved.  Likely prerenal related to decreased oral intake.  Now resolved after fluid resuscitation.  #Cough. Likely viral syndrome. Neg flu panel.  Tessalon Perles and Mucinex as needed.  Chest x-ray and blood cultures negative  #HTN, not at goal. Resume home lisinopril, unclear dosage will start at 20 mg and monitor. PRN IV hydralazine  #Hyperlipidemia, stable.  Continue home pravastatin  #Back pain.  Continue home tramadol  #Chronicpsych issues, stable. Continue home benztropine, risperdal, trazaodone    Code Status: Full code  Family Communication: No family at bedside  Disposition Plan: Transfer to floor from step down, monitor CBG status to be discharge next 24 to 48 hours   Consultants:  none     Procedures:  none   Antimicrobials: Anti-infectives (From admission, onward)   None         Cultures:  11/16/17: negative growth to date   Telemetry:no  DVT prophylaxis:   SCDs   Objective: Vitals:   11/17/17 0145 11/17/17 0409 11/17/17 0650 11/17/17 0800  BP: (!) 162/114  (!) 160/78 (!) 176/86  Pulse:    81  Resp:   17 19  Temp:  98.5 F (36.9 C)  100 F (37.8 C)  TempSrc:  Oral  Axillary  SpO2:    91%  Weight:      Height:        Intake/Output Summary (Last 24 hours) at 11/17/2017 0846 Last data filed at 11/17/2017 0800 Gross per 24 hour  Intake 2648.64 ml  Output 1000 ml  Net 1648.64 ml   Filed Weights   11/16/17 2324  Weight: 78.3 kg    Exam: Gen- young male lying in bed, comfortably Eyes-anicteric sclera, EOMI ENMT- moist oral mucosa CV-RRR, no peripheral edema Resp- normal respiratory effort on room air, clear breat sounds throughout Abdomen- soft, non-distended Skin- no rash or ulcers Neuro- grossly no focal neuro deficits Psych- tardive dyskinesia present, normal affect and mood. Alert and oriented x3.   Data Reviewed: CBC: Recent Labs  Lab 11/16/17 1858  WBC 13.3*  HGB 10.8*  HCT 33.4*  MCV 83.3  PLT 538*   Basic Metabolic Panel: Recent Labs  Lab 11/16/17 1858 11/17/17 0401 11/17/17 0803  NA 119* 134* 136  K 6.9* 4.1 3.9  CL 81* 100 102  CO2 19* 24 26  GLUCOSE 911* 284* 137*  BUN 39* 33* 32*  CREATININE 1.46* 1.14 1.08  CALCIUM 8.9 8.7* 8.7*  MG  --  2.2  --   PHOS  --  2.7  --  GFR: Estimated Creatinine Clearance: 88.2 mL/min (by C-G formula based on SCr of 1.08 mg/dL). Liver Function Tests: Recent Labs  Lab 11/16/17 1858  AST 22  ALT 21  ALKPHOS 138*  BILITOT 1.1  PROT 7.7  ALBUMIN 3.6   Recent Labs  Lab 11/16/17 1858  LIPASE 17   No results for input(s): AMMONIA in the last 168 hours. Coagulation Profile: No results for input(s): INR, PROTIME in the last 168 hours. Cardiac Enzymes: Recent Labs  Lab 11/16/17 2031  TROPONINI <0.03   BNP (last 3 results) No results for input(s): PROBNP in the last 8760 hours. HbA1C: No results for input(s): HGBA1C in the last 72  hours. CBG: Recent Labs  Lab 11/17/17 0247 11/17/17 0357 11/17/17 0501 11/17/17 0604 11/17/17 0701  GLUCAP 364* 285* 215* 178* 141*   Lipid Profile: No results for input(s): CHOL, HDL, LDLCALC, TRIG, CHOLHDL, LDLDIRECT in the last 72 hours. Thyroid Function Tests: No results for input(s): TSH, T4TOTAL, FREET4, T3FREE, THYROIDAB in the last 72 hours. Anemia Panel: No results for input(s): VITAMINB12, FOLATE, FERRITIN, TIBC, IRON, RETICCTPCT in the last 72 hours. Urine analysis:    Component Value Date/Time   COLORURINE STRAW (A) 11/16/2017 1937   APPEARANCEUR CLEAR 11/16/2017 1937   LABSPEC 1.020 11/16/2017 1937   PHURINE 6.0 11/16/2017 1937   GLUCOSEU >=500 (A) 11/16/2017 1937   HGBUR NEGATIVE 11/16/2017 1937   BILIRUBINUR NEGATIVE 11/16/2017 1937   KETONESUR 20 (A) 11/16/2017 1937   PROTEINUR NEGATIVE 11/16/2017 1937   NITRITE NEGATIVE 11/16/2017 1937   LEUKOCYTESUR NEGATIVE 11/16/2017 1937   Sepsis Labs: @LABRCNTIP (procalcitonin:4,lacticidven:4)  ) Recent Results (from the past 240 hour(s))  MRSA PCR Screening     Status: None   Collection Time: 11/16/17 11:17 PM  Result Value Ref Range Status   MRSA by PCR NEGATIVE NEGATIVE Final    Comment:        The GeneXpert MRSA Assay (FDA approved for NASAL specimens only), is one component of a comprehensive MRSA colonization surveillance program. It is not intended to diagnose MRSA infection nor to guide or monitor treatment for MRSA infections. Performed at [facility], [address], [place], [place] [address]       Studies: Dg Chest Port 1 View  Result Date: 11/16/2017 CLINICAL DATA:  Cough EXAM: PORTABLE CHEST 1 VIEW COMPARISON:  None. FINDINGS: Heart is borderline in size. Mild peribronchial thickening. No confluent opacities, effusions or edema. No acute bony abnormality. IMPRESSION: Heart upper limits normal in size.  Mild bronchitic changes. Electronically Signed   By: Charlett Nose  M.D.   On: 11/16/2017 20:33    Scheduled Meds: . benztropine  1 mg Oral BID  . folic acid  1 mg Oral Daily  . gabapentin  300 mg Oral BID  . insulin glargine  20 Units Subcutaneous Daily  . pneumococcal 23 valent vaccine  0.5 mL Intramuscular Tomorrow-1000  . pravastatin  40 mg Oral QHS  . risperiDONE  3 mg Oral BID  . traZODone  200 mg Oral QHS    Continuous Infusions: . sodium chloride 100 mL/hr at 11/17/17 0400  . dextrose 5 % and 0.45% NaCl 100 mL/hr at 11/17/17 0800  . insulin 1.8 mL/hr at 11/17/17 0800  . sodium chloride       LOS: 1 day     Laverna Peace, MD [facility] Pager [phone]  If 7PM-7AM, please contact night-coverage [email or web address] Password [email or web address] 11/17/2017, 8:46 AM

## 2017-11-17 NOTE — Care Management Note (Signed)
Case Management Note  Patient Details  Name: Connor Bailey MRN: [id number] Date of Birth: 1975/06/26  Subjective/Objective:                  dka  Action/Plan: Iv ns at 100cc/hrs, iv d51/2 ns at 100cc/hrs,iv human insulin-Myxredlin continuous drip  Will follow for progression of care and clinical status. Will follow for case management needs none present at this time. Expected Discharge Date:                  Expected Discharge Plan:  Home/Self Care  In-House Referral:     Discharge planning Services  CM Consult  Post Acute Care Choice:    Choice offered to:     DME Arranged:    DME Agency:     HH Arranged:    HH Agency:     Status of Service:  In process, will continue to follow  If discussed at Long Length of Stay Meetings, dates discussed:    Additional Comments:  Golda Acre, RN 11/17/2017, 9:15 AM

## 2017-11-18 DIAGNOSIS — R05 Cough: Secondary | ICD-10-CM

## 2017-11-18 DIAGNOSIS — E1310 Other specified diabetes mellitus with ketoacidosis without coma: Secondary | ICD-10-CM

## 2017-11-18 DIAGNOSIS — N179 Acute kidney failure, unspecified: Secondary | ICD-10-CM | POA: Diagnosis present

## 2017-11-18 DIAGNOSIS — I10 Essential (primary) hypertension: Secondary | ICD-10-CM

## 2017-11-18 DIAGNOSIS — E871 Hypo-osmolality and hyponatremia: Secondary | ICD-10-CM

## 2017-11-18 DIAGNOSIS — E785 Hyperlipidemia, unspecified: Secondary | ICD-10-CM

## 2017-11-18 DIAGNOSIS — R059 Cough, unspecified: Secondary | ICD-10-CM | POA: Diagnosis present

## 2017-11-18 DIAGNOSIS — E1169 Type 2 diabetes mellitus with other specified complication: Secondary | ICD-10-CM

## 2017-11-18 DIAGNOSIS — F99 Mental disorder, not otherwise specified: Secondary | ICD-10-CM

## 2017-11-18 DIAGNOSIS — E1110 Type 2 diabetes mellitus with ketoacidosis without coma: Principal | ICD-10-CM

## 2017-11-18 DIAGNOSIS — E875 Hyperkalemia: Secondary | ICD-10-CM

## 2017-11-18 DIAGNOSIS — N289 Disorder of kidney and ureter, unspecified: Secondary | ICD-10-CM

## 2017-11-18 LAB — BLOOD CULTURE ID PANEL (REFLEXED)
ACINETOBACTER BAUMANNII: NOT DETECTED
CANDIDA KRUSEI: NOT DETECTED
CANDIDA PARAPSILOSIS: NOT DETECTED
Candida albicans: NOT DETECTED
Candida glabrata: NOT DETECTED
Candida tropicalis: NOT DETECTED
ENTEROBACTERIACEAE SPECIES: NOT DETECTED
ESCHERICHIA COLI: NOT DETECTED
Enterobacter cloacae complex: NOT DETECTED
Enterococcus species: NOT DETECTED
Haemophilus influenzae: NOT DETECTED
KLEBSIELLA OXYTOCA: NOT DETECTED
Klebsiella pneumoniae: NOT DETECTED
Listeria monocytogenes: NOT DETECTED
METHICILLIN RESISTANCE: NOT DETECTED
Neisseria meningitidis: NOT DETECTED
PSEUDOMONAS AERUGINOSA: NOT DETECTED
Proteus species: NOT DETECTED
STREPTOCOCCUS PNEUMONIAE: NOT DETECTED
Serratia marcescens: NOT DETECTED
Staphylococcus aureus (BCID): NOT DETECTED
Staphylococcus species: DETECTED — AB
Streptococcus agalactiae: NOT DETECTED
Streptococcus pyogenes: NOT DETECTED
Streptococcus species: NOT DETECTED

## 2017-11-18 LAB — HEMOGLOBIN A1C
HEMOGLOBIN A1C: 11.1 % — AB (ref 4.8–5.6)
Mean Plasma Glucose: 272 mg/dL

## 2017-11-18 LAB — GLUCOSE, CAPILLARY
Glucose-Capillary: 212 mg/dL — ABNORMAL HIGH (ref 70–99)
Glucose-Capillary: 195 mg/dL — ABNORMAL HIGH (ref 70–99)

## 2017-11-18 MED ORDER — MORPHINE SULFATE (PF) 2 MG/ML IV SOLN: 1.0000 mg | Freq: Four times a day (QID) | INTRAVENOUS | Status: DC | PRN

## 2017-11-18 MED ORDER — MORPHINE BOLUS VIA INFUSION: 1.0000 mg | Freq: Four times a day (QID) | INTRAVENOUS | Status: DC | PRN

## 2017-11-18 MED ORDER — TRAMADOL HCL 50 MG PO TABS
50.0000 mg | ORAL_TABLET | Freq: Four times a day (QID) | ORAL | Status: DC | PRN
  Filled 2017-11-18: qty 1

## 2017-11-18 MED ORDER — BENZONATATE 100 MG PO CAPS: 100.0000 mg | ORAL_CAPSULE | Freq: Two times a day (BID) | ORAL | 0 refills | Status: DC | PRN

## 2017-11-18 MED ORDER — ACETAMINOPHEN 325 MG PO TABS: 650.0000 mg | ORAL_TABLET | Freq: Four times a day (QID) | ORAL | Status: DC | PRN

## 2017-11-18 MED ORDER — LISINOPRIL 20 MG PO TABS: 20.0000 mg | ORAL_TABLET | Freq: Every day | ORAL | Status: DC

## 2017-11-18 MED ORDER — HYDROCOD POLST-CPM POLST ER 10-8 MG/5ML PO SUER
5.0000 mL | Freq: Once | ORAL | Status: AC
  Administered 2017-11-18: 5 mL via ORAL
  Filled 2017-11-18: qty 5

## 2017-11-18 MED ORDER — GUAIFENESIN-DM 100-10 MG/5ML PO SYRP: 5.0000 mL | ORAL_SOLUTION | ORAL | 0 refills | Status: DC | PRN

## 2017-11-18 MED ORDER — INSULIN GLARGINE 100 UNIT/ML ~~LOC~~ SOLN
39.0000 [IU] | Freq: Every day | SUBCUTANEOUS | Status: DC
  Filled 2017-11-18: qty 0.39

## 2017-11-18 NOTE — Progress Notes (Addendum)
CSW aware of consult for transportation assistance and notes discharge summary for patient.  Patient reports living with a friend in [place], who may be able to give him a ride after 10:30am. CSW able to offer transportation assistance if the friend is not able to transport patient.  CSW following for discharge needs.   CSW spoke with patient at bedside a second time. Patient reports he is still working on getting a ride and reports his housemates are currently working. Patient recently relocated to a sober living home, "[facility]" and does not have a key to the home. Per patient, other residents will not return until after 1pm. CSW provided phone number for sober living home [phone]), attempted to reach someone in the home, but the call was not answered.   CSW continuing to follow for discharge and transportation needs.    Update: Patient reports he has secured a ride to his sober living house and is welcome to return. No social work needs identified. Signing off.   Enid Cutter, LCSW-A Clinical Social Worker [phone]

## 2017-11-18 NOTE — Discharge Summary (Signed)
Discharge Summary  [name]:[id number] DOB: 15-May-1975  PCP: Patient, No Pcp Per  Admit date: 11/16/2017 Discharge date: 11/18/2017   Time spent: < 25 minutes  Admitted From: home Disposition:  home  Recommendations for Outpatient Follow-up:  1. Follow up with PCP in 1 week. Contact for [facility] wellness provided 2. Continue Lantus 39 U and coverage insulin, will need to titrate lantus based of fasting blood glucose on PCP follow up 3. Tessalon pearls and robitussin PRN for cough    Discharge Diagnoses:  Active Hospital Problems   Diagnosis Date Noted  . Cough 11/18/2017  . Essential hypertension 11/18/2017  . Psychiatric diagnosis 11/18/2017  . Type 2 diabetes mellitus with hyperlipidemia (HCC) 11/18/2017    Resolved Hospital Problems   Diagnosis Date Noted Date Resolved  . Hyponatremia 11/18/2017 11/18/2017  . Hyperkalemia 11/18/2017 11/18/2017  . AKI (acute kidney injury) (HCC) 11/18/2017 11/18/2017  . DKA (diabetic ketoacidoses) [facility]) 11/16/2017 11/18/2017    Discharge Condition: Stable    History of present illness:  Connor Bailey is a 42 y.o. year old male with medical history significant for T2DM ( A1c 11%, 11/17/17), HTN who presented on 11/16/2017 with 1 week of flu like symptoms and was found to have DKA.  He was in his usual state of health until a week ago when he developed cough, nasal congestion, and sneezing. His roommates have all had similar symptos. Remaining hospital course addressed in problem based format below:   Hospital Course:   DKA. Monitored in stepdown unit on insulin gtt until anion gap closed in first 24 hours. Followed DKA protocol and was able to transition to home lantus 39 U and tolerated oral diet. Monitored in regular room where he continued to eat well. No changes to home lantus. A1c found to be 11 and given instructions to follow with PCP in case titration of lantus is needed ( fasting blood glucose in 200s here,  but states much better at home).    Hyponatremia and Hypokalemia. Nadir of 119 and peak of 6.9 both resolved with IVF and insulin. All related to above.   AKI, prerenal etiology. Related to decreased oral intake and fluid changes related to DKA. Resolved with fluid resuscitation.   Cough. Likely trigger for DKA. Flu panel negative. CXR with no pneumonia. Blood cultures were all unremarkable.  Did not require oxygen. Supportive care to continue with PRN tessalon pearls and robitussin.  HTN. SBP in 170s but resolved by resuming his home lisinopril 20 mg which he will continue on discharge  HLD. Continue home pravastatin  Chronic psych issues. Remained stable and will continue previous regimen of benztropine, risperdal, and trazodone.    Consultations:  none  Procedures/Studies: none  Discharge Exam: BP 131/79 (BP Location: Left Arm)   Pulse 80   Temp 98 F (36.7 C) (Oral)   Resp 18   Ht 5\' 9"  (1.753 m)   Wt 78.3 kg   SpO2 93%   BMI 25.49 kg/m   General: Lying in bed, no apparent distress Eyes: EOMI, anicteric ENT: Oral Mucosa clear and moist Cardiovascular: regular rate and rhythm, no murmurs, rubs or gallops, no edema, Respiratory: Normal respiratory effort on room air, lungs clear to auscultation bilaterally Abdomen: soft, non-distended, non-tender, normal bowel sounds Skin: No Rash Neurologic: Grossly no focal neuro deficit.Mental status AAOx3, speech normal,  Psychiatric:Appropriate affect, and mood, tardive dyskinesia present   Discharge Instructions You were cared for by a hospitalist during your hospital stay. If you have  any questions about your discharge medications or the care you received while you were in the hospital after you are discharged, you can call the unit and asked to speak with the hospitalist on call if the hospitalist that took care of you is not available. Once you are discharged, your primary care physician will handle any further medical issues.  Please note that NO REFILLS for any discharge medications will be authorized once you are discharged, as it is imperative that you return to your primary care physician (or establish a relationship with a primary care physician if you do not have one) for your aftercare needs so that they can reassess your need for medications and monitor your lab values.  Discharge Instructions    Diet - low sodium heart healthy   Complete by:  As directed    Increase activity slowly   Complete by:  As directed      Allergies as of 11/18/2017   No Known Allergies     Medication List    TAKE these medications   benzonatate 100 MG capsule Commonly known as:  TESSALON Take 1 capsule (100 mg total) by mouth 2 (two) times daily as needed for cough.   benztropine 1 MG tablet Commonly known as:  COGENTIN Take 1 mg by mouth 2 (two) times daily.   folic acid 1 MG tablet Commonly known as:  FOLVITE Take 1 mg by mouth daily.   gabapentin 300 MG capsule Commonly known as:  NEURONTIN Take 300 mg by mouth 2 (two) times daily.   guaiFENesin-dextromethorphan 100-10 MG/5ML syrup Commonly known as:  ROBITUSSIN DM Take 5 mLs by mouth every 4 (four) hours as needed for cough.   insulin aspart 100 UNIT/ML injection Commonly known as:  novoLOG Inject 0-10 Units into the skin 3 (three) times daily before meals.   insulin glargine 100 UNIT/ML injection Commonly known as:  LANTUS Inject 39 Units into the skin at bedtime.   lisinopril 20 MG tablet Commonly known as:  PRINIVIL,ZESTRIL Take 1 tablet (20 mg total) by mouth daily.   pravastatin 40 MG tablet Commonly known as:  PRAVACHOL Take 40 mg by mouth at bedtime.   risperiDONE 3 MG tablet Commonly known as:  RISPERDAL Take 3 mg by mouth 2 (two) times daily.   traMADol 50 MG tablet Commonly known as:  ULTRAM Take 50 mg by mouth every 6 (six) hours as needed for moderate pain or severe pain.   traZODone 100 MG tablet Commonly known as:   DESYREL Take 200 mg by mouth at bedtime.      No Known Allergies Follow-up Information    [facility] Follow up in 1 week(s).   Why:  make appointment to establish primary care physician Contact information: [address] [place] [address] [phone]           The results of significant diagnostics from this hospitalization (including imaging, microbiology, ancillary and laboratory) are listed below for reference.    Significant Diagnostic Studies: Dg Chest Port 1 View  Result Date: 11/16/2017 CLINICAL DATA:  Cough EXAM: PORTABLE CHEST 1 VIEW COMPARISON:  None. FINDINGS: Heart is borderline in size. Mild peribronchial thickening. No confluent opacities, effusions or edema. No acute bony abnormality. IMPRESSION: Heart upper limits normal in size.  Mild bronchitic changes. Electronically Signed   By: Charlett Nose M.D.   On: 11/16/2017 20:33    Microbiology: Recent Results (from the past 240 hour(s))  MRSA PCR Screening  Status: None   Collection Time: 11/16/17 11:17 PM  Result Value Ref Range Status   MRSA by PCR NEGATIVE NEGATIVE Final    Comment:        The GeneXpert MRSA Assay (FDA approved for NASAL specimens only), is one component of a comprehensive MRSA colonization surveillance program. It is not intended to diagnose MRSA infection nor to guide or monitor treatment for MRSA infections. Performed at [facility], [address] [place], [place] [address]   Culture, blood (routine x 2)     Status: None (Preliminary result)   Collection Time: 11/17/17  4:01 AM  Result Value Ref Range Status   Specimen Description   Final    BLOOD RIGHT ARM Performed at [facility], [address], [place], [place] [address]    Special Requests   Final    BOTTLES DRAWN AEROBIC ONLY Blood Culture adequate volume Performed at [facility], [address]  [place], [place] [address]    Culture   Final    NO GROWTH < 24 HOURS Performed at [facility], [address], [place], [place] [address]    Report Status PENDING  Incomplete  Culture, blood (routine x 2)     Status: None (Preliminary result)   Collection Time: 11/17/17  4:01 AM  Result Value Ref Range Status   Specimen Description   Final    BLOOD RIGHT HAND Performed at [facility], [address] [place], [place] [address]    Special Requests   Final    BOTTLES DRAWN AEROBIC ONLY Blood Culture adequate volume Performed at [facility], [address] [place], [place] [address]    Culture   Final    NO GROWTH < 24 HOURS Performed at [facility], [address] [place], [place] [address]    Report Status PENDING  Incomplete     Labs: Basic Metabolic Panel: Recent Labs  Lab 11/16/17 1858 11/17/17 0401 11/17/17 0803 11/17/17 1053  NA 119* 134* 136 136  K 6.9* 4.1 3.9 4.1  CL 81* 100 102 102  CO2 19* 24 26 25   GLUCOSE 911* 284* 137* 141*  BUN 39* 33* 32* 31*  CREATININE 1.46* 1.14 1.08 0.98  CALCIUM 8.9 8.7* 8.7* 8.5*  MG  --  2.2  --   --   PHOS  --  2.7  --   --    Liver Function Tests: Recent Labs  Lab 11/16/17 1858  AST 22  ALT 21  ALKPHOS 138*  BILITOT 1.1  PROT 7.7  ALBUMIN 3.6   Recent Labs  Lab 11/16/17 1858  LIPASE 17   No results for input(s): AMMONIA in the last 168 hours. CBC: Recent Labs  Lab 11/16/17 1858  WBC 13.3*  HGB 10.8*  HCT 33.4*  MCV 83.3  PLT 538*   Cardiac Enzymes: Recent Labs  Lab 11/16/17 2031  TROPONINI <0.03   BNP: BNP (last 3 results) No results for input(s): BNP in the last 8760 hours.  ProBNP (last 3 results) No results for input(s): PROBNP in the last 8760 hours.  CBG: Recent Labs  Lab 11/17/17 1305 11/17/17 1456 11/17/17 1553 11/17/17 2110 11/18/17 0730  GLUCAP 245* 298* 308* 292* 212*       Signed:  Laverna Peace, MD [facility] 11/18/2017, 9:12 AM

## 2017-11-20 ENCOUNTER — Emergency Department (HOSPITAL_COMMUNITY)
Admission: EM | Admit: 2017-11-20 | Discharge: 2017-11-21 | Disposition: A | Discharge status: Other Acute Inpatient Hospital | Payer: Federal, State, Local not specified - Other | Attending: Medical | Admitting: Medical

## 2017-11-20 ENCOUNTER — Other Ambulatory Visit: Payer: Self-pay

## 2017-11-20 ENCOUNTER — Encounter (HOSPITAL_COMMUNITY): Payer: Self-pay

## 2017-11-20 ENCOUNTER — Emergency Department (HOSPITAL_COMMUNITY): Payer: Self-pay

## 2017-11-20 DIAGNOSIS — Z794 Long term (current) use of insulin: Secondary | ICD-10-CM | POA: Insufficient documentation

## 2017-11-20 DIAGNOSIS — Y901 Blood alcohol level of 20-39 mg/100 ml: Secondary | ICD-10-CM | POA: Insufficient documentation

## 2017-11-20 DIAGNOSIS — F10229 Alcohol dependence with intoxication, unspecified: Secondary | ICD-10-CM

## 2017-11-20 DIAGNOSIS — E1165 Type 2 diabetes mellitus with hyperglycemia: Secondary | ICD-10-CM | POA: Insufficient documentation

## 2017-11-20 DIAGNOSIS — R739 Hyperglycemia, unspecified: Secondary | ICD-10-CM

## 2017-11-20 DIAGNOSIS — R197 Diarrhea, unspecified: Secondary | ICD-10-CM | POA: Insufficient documentation

## 2017-11-20 DIAGNOSIS — Z79899 Other long term (current) drug therapy: Secondary | ICD-10-CM | POA: Insufficient documentation

## 2017-11-20 DIAGNOSIS — R45851 Suicidal ideations: Secondary | ICD-10-CM | POA: Insufficient documentation

## 2017-11-20 DIAGNOSIS — R109 Unspecified abdominal pain: Secondary | ICD-10-CM | POA: Insufficient documentation

## 2017-11-20 DIAGNOSIS — R05 Cough: Secondary | ICD-10-CM | POA: Insufficient documentation

## 2017-11-20 DIAGNOSIS — F319 Bipolar disorder, unspecified: Secondary | ICD-10-CM | POA: Insufficient documentation

## 2017-11-20 DIAGNOSIS — F1014 Alcohol abuse with alcohol-induced mood disorder: Secondary | ICD-10-CM | POA: Diagnosis present

## 2017-11-20 DIAGNOSIS — I10 Essential (primary) hypertension: Secondary | ICD-10-CM | POA: Insufficient documentation

## 2017-11-20 DIAGNOSIS — R112 Nausea with vomiting, unspecified: Secondary | ICD-10-CM | POA: Insufficient documentation

## 2017-11-20 LAB — COMPREHENSIVE METABOLIC PANEL
ALBUMIN: 3.6 g/dL (ref 3.5–5.0)
ALK PHOS: 107 U/L (ref 38–126)
ALT: 13 U/L (ref 0–44)
AST: 14 U/L — AB (ref 15–41)
Anion gap: 12 (ref 5–15)
BILIRUBIN TOTAL: 0.5 mg/dL (ref 0.3–1.2)
BUN: 13 mg/dL (ref 6–20)
CALCIUM: 8.8 mg/dL — AB (ref 8.9–10.3)
CO2: 20 mmol/L — AB (ref 22–32)
Chloride: 100 mmol/L (ref 98–111)
Creatinine, Ser: 1.01 mg/dL (ref 0.61–1.24)
GFR calc Af Amer: >60 mL/min (ref >60)
GFR calc non Af Amer: >60 mL/min (ref >60)
GLUCOSE: 491 mg/dL — AB (ref 70–99)
Potassium: 4.1 mmol/L (ref 3.5–5.1)
Sodium: 132 mmol/L — ABNORMAL LOW (ref 135–145)
TOTAL PROTEIN: 7.3 g/dL (ref 6.5–8.1)

## 2017-11-20 LAB — CBC
HEMATOCRIT: 34 % — AB (ref 39.0–52.0)
HEMOGLOBIN: 10.8 g/dL — AB (ref 13.0–17.0)
MCH: 26.7 pg (ref 26.0–34.0)
MCHC: 31.8 g/dL (ref 30.0–36.0)
MCV: 84 fL (ref 80.0–100.0)
Platelets: 636 10*3/uL — ABNORMAL HIGH (ref 150–400)
RBC: 4.05 MIL/uL — AB (ref 4.22–5.81)
RDW: 14.6 % (ref 11.5–15.5)
WBC: 8.7 10*3/uL (ref 4.0–10.5)
nRBC: 0 % (ref 0.0–0.2)

## 2017-11-20 LAB — CULTURE, BLOOD (ROUTINE X 2): SPECIAL REQUESTS: ADEQUATE

## 2017-11-20 LAB — RAPID URINE DRUG SCREEN, HOSP PERFORMED
AMPHETAMINES: NOT DETECTED
BARBITURATES: NOT DETECTED
Benzodiazepines: NOT DETECTED
COCAINE: NOT DETECTED
Opiates: NOT DETECTED
Tetrahydrocannabinol: NOT DETECTED

## 2017-11-20 LAB — CBG MONITORING, ED
Glucose-Capillary: 305 mg/dL — ABNORMAL HIGH (ref 70–99)
Glucose-Capillary: 258 mg/dL — ABNORMAL HIGH (ref 70–99)

## 2017-11-20 LAB — SALICYLATE LEVEL: Salicylate Lvl: <7 mg/dL (ref 2.8–30.0)

## 2017-11-20 LAB — ACETAMINOPHEN LEVEL: Acetaminophen (Tylenol), Serum: 19 ug/mL (ref 10–30)

## 2017-11-20 LAB — ETHANOL: Alcohol, Ethyl (B): 37 mg/dL — ABNORMAL HIGH (ref <10)

## 2017-11-20 MED ORDER — LORAZEPAM 1 MG PO TABS
0.0000 mg | ORAL_TABLET | Freq: Four times a day (QID) | ORAL | Status: DC
  Administered 2017-11-21: 1 mg via ORAL
  Filled 2017-11-20: qty 1

## 2017-11-20 MED ORDER — SODIUM CHLORIDE 0.9 % IV BOLUS
1000.0000 mL | Freq: Once | INTRAVENOUS | Status: AC
  Administered 2017-11-20: 1000 mL via INTRAVENOUS

## 2017-11-20 MED ORDER — LORAZEPAM 2 MG/ML IJ SOLN: 0.0000 mg | Freq: Two times a day (BID) | INTRAMUSCULAR | Status: DC

## 2017-11-20 MED ORDER — VITAMIN B-1 100 MG PO TABS
100.0000 mg | ORAL_TABLET | Freq: Every day | ORAL | Status: DC
  Administered 2017-11-20 – 2017-11-21 (×2): 100 mg via ORAL
  Filled 2017-11-20 (×2): qty 1

## 2017-11-20 MED ORDER — LORAZEPAM 2 MG/ML IJ SOLN
0.0000 mg | Freq: Four times a day (QID) | INTRAMUSCULAR | Status: DC
  Administered 2017-11-20: 2 mg via INTRAVENOUS
  Filled 2017-11-20: qty 1

## 2017-11-20 MED ORDER — THIAMINE HCL 100 MG/ML IJ SOLN: 100.0000 mg | Freq: Every day | INTRAMUSCULAR | Status: DC

## 2017-11-20 MED ORDER — IBUPROFEN 200 MG PO TABS
600.0000 mg | ORAL_TABLET | Freq: Once | ORAL | Status: AC
  Administered 2017-11-20: 600 mg via ORAL
  Filled 2017-11-20: qty 3

## 2017-11-20 MED ORDER — LORAZEPAM 1 MG PO TABS: 0.0000 mg | ORAL_TABLET | Freq: Two times a day (BID) | ORAL | Status: DC

## 2017-11-20 NOTE — ED Notes (Signed)
Bed: [facility]PT4 Expected date:  Expected time:  Means of arrival:  Comments: 

## 2017-11-20 NOTE — BH Assessment (Addendum)
Assessment Note  Connor Bailey is an 42 y.o. male. Pt reports SI. When asked about a SI plan Pt states "I'll do the easiest thing." Pt denies previous SI attempt. Pt denies HI/AVH. Pt states he currently lives in a sober living home. Pt reports drinking daily. Pt states "I drink 40oz beers." Pt denies previous or current mental health treatment. Pt denies current mental health medication. Pt states he's from [place], [place] and he came to [place] for live at the [facility].   Dr. Sharma Covert recommends am psych evaluation.   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 Diabetes mellitus without complication [facility])     Past Surgical History:  Procedure Laterality Date  . CLAVICLE SURGERY      Family History:  Family History  Problem Relation Age of Onset  . Fibromyalgia Mother     Social History:  reports that he has never smoked. He has never used smokeless tobacco. He reports that he does not drink alcohol or use drugs.  Additional Social History:  Alcohol / Drug Use Pain Medications: please see mar Prescriptions: please see mar Over the Counter: please see mar History of alcohol / drug use?: Yes Longest period of sobriety (when/how long): unknown Negative Consequences of Use: Financial, Legal, Personal relationships, Work / School Withdrawal Symptoms: Diarrhea, Delirium, Weakness, Sweats, Tingling Substance #1 Name of Substance 1: alcohol 1 - Age of First Use: unknown 1 - Amount (size/oz): "as much as I can" 1 - Frequency: daily 1 - Duration: ongoing 1 - Last Use / Amount: 1024/19  CIWA: CIWA-Ar BP: (!) 192/70 Pulse Rate: 91 COWS:    Allergies: No Known Allergies  Home Medications:  (Not in a hospital admission)  OB/GYN Status:  No LMP for male patient.  General Assessment Data TTS Assessment: In system Is this a Tele or Face-to-Face Assessment?: Face-to-Face Is this an Initial  Assessment or a Re-assessment for this encounter?: Initial Assessment Patient Accompanied by:: N/A Language Other than English: No Living Arrangements: Other (Comment) What gender do you identify as?: Male Marital status: Single Maiden name: NA Pregnancy Status: No Living Arrangements: Non-relatives/Friends Can pt return to current living arrangement?: Yes Admission Status: Voluntary Is patient capable of signing voluntary admission?: Yes Referral Source: Self/Family/Friend Insurance type: Medicaid     Crisis Care Plan Living Arrangements: Non-relatives/Friends Legal Guardian: Other:(self) Name of Psychiatrist: NA Name of Therapist: NA  Education Status Is patient currently in school?: No Is the patient employed, unemployed or receiving disability?: Unemployed  Risk to self with the past 6 months Suicidal Ideation: Yes-Currently Present Has patient been a risk to self within the past 6 months prior to admission? : No Suicidal Intent: Yes-Currently Present Has patient had any suicidal intent within the past 6 months prior to admission? : No Is patient at risk for suicide?: Yes Suicidal Plan?: Yes-Currently Present Specify Current Suicidal Plan: "the easiest way possible" Access to Means: No What has been your use of drugs/alcohol within the last 12 months?: alcohol Previous Attempts/Gestures: No How many times?: 0 Other Self Harm Risks: NA Triggers for Past Attempts: None known Intentional Self Injurious Behavior: None Family Suicide History: No Recent stressful life event(s): Other (Comment)(SA) Persecutory voices/beliefs?: No Depression: Yes Depression Symptoms: Tearfulness, Fatigue, Isolating, Guilt, Feeling worthless/self pity, Loss of interest in usual pleasures, Feeling angry/irritable Substance abuse history and/or treatment for substance abuse?: No Suicide prevention information given to non-admitted patients:  Not applicable  Risk to Others within the past 6  months Homicidal Ideation: No Does patient have any lifetime risk of violence toward others beyond the six months prior to admission? : No Thoughts of Harm to Others: No Current Homicidal Intent: No Current Homicidal Plan: No Access to Homicidal Means: No Identified Victim: NA History of harm to others?: No Assessment of Violence: None Noted Violent Behavior Description: NA Does patient have access to weapons?: No Criminal Charges Pending?: No Does patient have a court date: No Is patient on probation?: No  Psychosis Hallucinations: None noted Delusions: None noted  Mental Status Report Appearance/Hygiene: Unremarkable Eye Contact: Fair Motor Activity: Freedom of movement Speech: Logical/coherent Level of Consciousness: Alert Mood: Depressed Affect: Depressed Anxiety Level: None Thought Processes: Coherent, Relevant Judgement: Unimpaired Orientation: Person, Place, Time, Situation Obsessive Compulsive Thoughts/Behaviors: None  Cognitive Functioning Concentration: Normal Memory: Recent Intact, Remote Intact Is patient IDD: No Insight: Fair Impulse Control: Fair Appetite: Fair Have you had any weight changes? : No Change Sleep: No Change Total Hours of Sleep: 8 Vegetative Symptoms: None  ADLScreening [facility] Assessment Services) Patient's cognitive ability adequate to safely complete daily activities?: Yes Patient able to express need for assistance with ADLs?: Yes Independently performs ADLs?: Yes (appropriate for developmental age)        ADL Screening (condition at time of admission) Patient's cognitive ability adequate to safely complete daily activities?: Yes Is the patient deaf or have difficulty hearing?: No Does the patient have difficulty seeing, even when wearing glasses/contacts?: No Does the patient have difficulty concentrating, remembering, or making decisions?: No Patient able to express need for assistance with ADLs?: Yes Does the patient have  difficulty dressing or bathing?: No Independently performs ADLs?: Yes (appropriate for developmental age) Does the patient have difficulty walking or climbing stairs?: Yes       Abuse/Neglect Assessment (Assessment to be complete while patient is alone) Abuse/Neglect Assessment Can Be Completed: Yes Physical Abuse: Denies Verbal Abuse: Denies Sexual Abuse: Denies Exploitation of patient/patient's resources: Denies     Advance Directives (For Healthcare) Does Patient Have a Medical Advance Directive?: No Would patient like information on creating a medical advance directive?: No - Patient declined          Disposition:     On Site Evaluation by:   Reviewed with Physician:    Emmit Pomfret 11/20/2017 4:33 PM

## 2017-11-20 NOTE — ED Notes (Signed)
Informed Mia McDonald PA-C about pt's home meds especially insulin with blood glucose at 491. IV has been ordered and started.
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
## 2017-11-20 NOTE — ED Triage Notes (Signed)
Patient reports that he is suicidal and his plan would be to jump in front of a car.  Patient denies any auditory , visual hallucinations an HI.  Patient also c/o a productive cough with green sputum x 2 weeks.

## 2017-11-20 NOTE — ED Notes (Signed)
Pt to be reassessed in AM for further treatment by psych.

## 2017-11-21 ENCOUNTER — Encounter (HOSPITAL_COMMUNITY): Payer: Self-pay | Admitting: *Deleted

## 2017-11-21 ENCOUNTER — Inpatient Hospital Stay (HOSPITAL_COMMUNITY)
Admission: AD | Admit: 2017-11-21 | Discharge: 2017-11-24 | DRG: 885 | Disposition: A | Discharge status: Home / Self Care | Payer: Federal, State, Local not specified - Other | Source: Intra-hospital | Attending: Psychiatry | Admitting: Psychiatry

## 2017-11-21 ENCOUNTER — Encounter (HOSPITAL_COMMUNITY): Payer: Self-pay | Admitting: Registered Nurse

## 2017-11-21 DIAGNOSIS — F332 Major depressive disorder, recurrent severe without psychotic features: Secondary | ICD-10-CM | POA: Diagnosis not present

## 2017-11-21 DIAGNOSIS — F1094 Alcohol use, unspecified with alcohol-induced mood disorder: Secondary | ICD-10-CM

## 2017-11-21 DIAGNOSIS — Y908 Blood alcohol level of 240 mg/100 ml or more: Secondary | ICD-10-CM | POA: Diagnosis present

## 2017-11-21 DIAGNOSIS — J069 Acute upper respiratory infection, unspecified: Secondary | ICD-10-CM | POA: Diagnosis present

## 2017-11-21 DIAGNOSIS — F17200 Nicotine dependence, unspecified, uncomplicated: Secondary | ICD-10-CM | POA: Diagnosis present

## 2017-11-21 DIAGNOSIS — F1120 Opioid dependence, uncomplicated: Secondary | ICD-10-CM | POA: Diagnosis present

## 2017-11-21 DIAGNOSIS — Z794 Long term (current) use of insulin: Secondary | ICD-10-CM | POA: Diagnosis not present

## 2017-11-21 DIAGNOSIS — F333 Major depressive disorder, recurrent, severe with psychotic symptoms: Secondary | ICD-10-CM | POA: Diagnosis present

## 2017-11-21 DIAGNOSIS — I10 Essential (primary) hypertension: Secondary | ICD-10-CM | POA: Diagnosis present

## 2017-11-21 DIAGNOSIS — G8929 Other chronic pain: Secondary | ICD-10-CM | POA: Diagnosis present

## 2017-11-21 DIAGNOSIS — G4700 Insomnia, unspecified: Secondary | ICD-10-CM | POA: Diagnosis present

## 2017-11-21 DIAGNOSIS — Z79899 Other long term (current) drug therapy: Secondary | ICD-10-CM | POA: Diagnosis not present

## 2017-11-21 DIAGNOSIS — F602 Antisocial personality disorder: Secondary | ICD-10-CM | POA: Diagnosis present

## 2017-11-21 DIAGNOSIS — Z765 Malingerer [conscious simulation]: Secondary | ICD-10-CM | POA: Diagnosis not present

## 2017-11-21 DIAGNOSIS — R45851 Suicidal ideations: Secondary | ICD-10-CM

## 2017-11-21 DIAGNOSIS — F419 Anxiety disorder, unspecified: Secondary | ICD-10-CM | POA: Diagnosis not present

## 2017-11-21 DIAGNOSIS — E119 Type 2 diabetes mellitus without complications: Secondary | ICD-10-CM | POA: Diagnosis present

## 2017-11-21 DIAGNOSIS — E785 Hyperlipidemia, unspecified: Secondary | ICD-10-CM | POA: Diagnosis present

## 2017-11-21 DIAGNOSIS — F1014 Alcohol abuse with alcohol-induced mood disorder: Secondary | ICD-10-CM | POA: Diagnosis present

## 2017-11-21 DIAGNOSIS — F410 Panic disorder [episodic paroxysmal anxiety] without agoraphobia: Secondary | ICD-10-CM | POA: Diagnosis present

## 2017-11-21 DIAGNOSIS — F411 Generalized anxiety disorder: Secondary | ICD-10-CM | POA: Diagnosis present

## 2017-11-21 DIAGNOSIS — F10239 Alcohol dependence with withdrawal, unspecified: Secondary | ICD-10-CM | POA: Diagnosis present

## 2017-11-21 LAB — GLUCOSE, CAPILLARY
Glucose-Capillary: 255 mg/dL — ABNORMAL HIGH (ref 70–99)
Glucose-Capillary: 355 mg/dL — ABNORMAL HIGH (ref 70–99)
Glucose-Capillary: 222 mg/dL — ABNORMAL HIGH (ref 70–99)

## 2017-11-21 LAB — CBG MONITORING, ED
GLUCOSE-CAPILLARY: 96 mg/dL (ref 70–99)
Glucose-Capillary: 55 mg/dL — ABNORMAL LOW (ref 70–99)
Glucose-Capillary: 151 mg/dL — ABNORMAL HIGH (ref 70–99)
Glucose-Capillary: 81 mg/dL (ref 70–99)

## 2017-11-21 MED ORDER — INSULIN ASPART 100 UNIT/ML ~~LOC~~ SOLN
0.0000 [IU] | Freq: Three times a day (TID) | SUBCUTANEOUS | Status: DC
  Administered 2017-11-21: 15 [IU] via SUBCUTANEOUS
  Administered 2017-11-22 (×2): 3 [IU] via SUBCUTANEOUS
  Administered 2017-11-23: 8 [IU] via SUBCUTANEOUS
  Administered 2017-11-23 – 2017-11-24 (×2): 11 [IU] via SUBCUTANEOUS
  Administered 2017-11-24: 8 [IU] via SUBCUTANEOUS

## 2017-11-21 MED ORDER — CHLORDIAZEPOXIDE HCL 25 MG PO CAPS
ORAL_CAPSULE | ORAL | Status: AC
  Filled 2017-11-21: qty 1

## 2017-11-21 MED ORDER — INSULIN GLARGINE 100 UNIT/ML ~~LOC~~ SOLN
39.0000 [IU] | Freq: Every day | SUBCUTANEOUS | Status: DC
  Administered 2017-11-21: 39 [IU] via SUBCUTANEOUS
  Filled 2017-11-21 (×2): qty 0.39

## 2017-11-21 MED ORDER — LOPERAMIDE HCL 2 MG PO CAPS
2.0000 mg | ORAL_CAPSULE | ORAL | Status: AC | PRN
  Administered 2017-11-22: 4 mg via ORAL
  Filled 2017-11-21: qty 1

## 2017-11-21 MED ORDER — ADULT MULTIVITAMIN W/MINERALS CH
1.0000 | ORAL_TABLET | Freq: Every day | ORAL | Status: DC
  Administered 2017-11-21 – 2017-11-24 (×4): 1 via ORAL
  Filled 2017-11-21 (×5): qty 1

## 2017-11-21 MED ORDER — TRAZODONE HCL 100 MG PO TABS
100.0000 mg | ORAL_TABLET | Freq: Every evening | ORAL | Status: DC | PRN
  Administered 2017-11-21: 100 mg via ORAL
  Filled 2017-11-21 (×5): qty 1

## 2017-11-21 MED ORDER — TRAZODONE HCL 100 MG PO TABS
ORAL_TABLET | ORAL | Status: AC
  Administered 2017-11-21: 100 mg
  Filled 2017-11-21: qty 1

## 2017-11-21 MED ORDER — ZIPRASIDONE MESYLATE 20 MG IM SOLR
20.0000 mg | Freq: Two times a day (BID) | INTRAMUSCULAR | Status: DC | PRN
  Administered 2017-11-21 – 2017-11-22 (×2): 20 mg via INTRAMUSCULAR
  Filled 2017-11-21 (×2): qty 20

## 2017-11-21 MED ORDER — BENZONATATE 100 MG PO CAPS
100.0000 mg | ORAL_CAPSULE | Freq: Two times a day (BID) | ORAL | Status: DC | PRN
  Administered 2017-11-21: 100 mg via ORAL
  Filled 2017-11-21: qty 1

## 2017-11-21 MED ORDER — ONDANSETRON 4 MG PO TBDP: 4.0000 mg | ORAL_TABLET | Freq: Four times a day (QID) | ORAL | Status: DC | PRN

## 2017-11-21 MED ORDER — BENZONATATE 100 MG PO CAPS
100.0000 mg | ORAL_CAPSULE | Freq: Two times a day (BID) | ORAL | Status: DC | PRN
  Administered 2017-11-22 (×2): 100 mg via ORAL
  Filled 2017-11-21 (×2): qty 1

## 2017-11-21 MED ORDER — PRAVASTATIN SODIUM 40 MG PO TABS
40.0000 mg | ORAL_TABLET | Freq: Every day | ORAL | Status: DC
  Administered 2017-11-21 – 2017-11-23 (×3): 40 mg via ORAL
  Filled 2017-11-21 (×4): qty 1
  Filled 2017-11-21: qty 7

## 2017-11-21 MED ORDER — BENZTROPINE MESYLATE 1 MG PO TABS
1.0000 mg | ORAL_TABLET | Freq: Two times a day (BID) | ORAL | Status: DC
  Filled 2017-11-21 (×2): qty 1

## 2017-11-21 MED ORDER — BENZTROPINE MESYLATE 1 MG PO TABS
1.0000 mg | ORAL_TABLET | Freq: Two times a day (BID) | ORAL | Status: DC
  Administered 2017-11-21 (×2): 1 mg via ORAL
  Filled 2017-11-21 (×2): qty 1

## 2017-11-21 MED ORDER — LISINOPRIL 20 MG PO TABS
20.0000 mg | ORAL_TABLET | Freq: Every day | ORAL | Status: DC
  Administered 2017-11-22: 20 mg via ORAL
  Filled 2017-11-21 (×3): qty 1

## 2017-11-21 MED ORDER — LISINOPRIL 20 MG PO TABS
20.0000 mg | ORAL_TABLET | Freq: Every day | ORAL | Status: DC
  Administered 2017-11-21: 20 mg via ORAL
  Filled 2017-11-21: qty 1

## 2017-11-21 MED ORDER — RISPERIDONE 1 MG PO TABS
1.0000 mg | ORAL_TABLET | Freq: Two times a day (BID) | ORAL | Status: DC
  Filled 2017-11-21 (×2): qty 1

## 2017-11-21 MED ORDER — HYDROXYZINE HCL 50 MG PO TABS
50.0000 mg | ORAL_TABLET | Freq: Once | ORAL | Status: AC
  Administered 2017-11-21: 50 mg via ORAL
  Filled 2017-11-21: qty 1

## 2017-11-21 MED ORDER — RISPERIDONE 2 MG PO TABS
2.0000 mg | ORAL_TABLET | Freq: Two times a day (BID) | ORAL | Status: DC
  Administered 2017-11-21 – 2017-11-22 (×3): 2 mg via ORAL
  Filled 2017-11-21 (×8): qty 1

## 2017-11-21 MED ORDER — HYDROXYZINE HCL 50 MG PO TABS
50.0000 mg | ORAL_TABLET | Freq: Four times a day (QID) | ORAL | Status: DC | PRN
  Administered 2017-11-22 – 2017-11-23 (×3): 50 mg via ORAL
  Filled 2017-11-21 (×5): qty 1

## 2017-11-21 MED ORDER — HYDROXYZINE HCL 25 MG PO TABS
25.0000 mg | ORAL_TABLET | Freq: Four times a day (QID) | ORAL | Status: DC | PRN
  Administered 2017-11-21: 25 mg via ORAL
  Filled 2017-11-21: qty 1

## 2017-11-21 MED ORDER — LORAZEPAM 1 MG PO TABS
1.0000 mg | ORAL_TABLET | ORAL | Status: AC | PRN
  Administered 2017-11-21: 1 mg via ORAL
  Filled 2017-11-21: qty 1

## 2017-11-21 MED ORDER — CHLORDIAZEPOXIDE HCL 25 MG PO CAPS
25.0000 mg | ORAL_CAPSULE | Freq: Four times a day (QID) | ORAL | Status: DC | PRN
  Administered 2017-11-21 – 2017-11-22 (×2): 25 mg via ORAL
  Filled 2017-11-21 (×2): qty 1

## 2017-11-21 MED ORDER — ACETAMINOPHEN 500 MG PO TABS
ORAL_TABLET | ORAL | Status: AC
  Administered 2017-11-21: 15:00:00
  Filled 2017-11-21: qty 1

## 2017-11-21 MED ORDER — BENZTROPINE MESYLATE 0.5 MG PO TABS
0.5000 mg | ORAL_TABLET | Freq: Two times a day (BID) | ORAL | Status: DC
  Administered 2017-11-21 – 2017-11-22 (×2): 0.5 mg via ORAL
  Filled 2017-11-21 (×5): qty 1

## 2017-11-21 MED ORDER — PANTOPRAZOLE SODIUM 40 MG PO TBEC
40.0000 mg | DELAYED_RELEASE_TABLET | Freq: Every day | ORAL | Status: DC
  Administered 2017-11-21 – 2017-11-24 (×4): 40 mg via ORAL
  Filled 2017-11-21 (×6): qty 1

## 2017-11-21 MED ORDER — INSULIN ASPART 100 UNIT/ML ~~LOC~~ SOLN: 0.0000 [IU] | Freq: Three times a day (TID) | SUBCUTANEOUS | Status: DC

## 2017-11-21 MED ORDER — INSULIN GLARGINE 100 UNIT/ML ~~LOC~~ SOLN
39.0000 [IU] | Freq: Every day | SUBCUTANEOUS | Status: DC
  Administered 2017-11-21 – 2017-11-23 (×3): 39 [IU] via SUBCUTANEOUS
  Filled 2017-11-21 (×2): qty 0.39

## 2017-11-21 MED ORDER — ADULT MULTIVITAMIN W/MINERALS CH
ORAL_TABLET | ORAL | Status: AC
  Administered 2017-11-21: 1 via ORAL
  Filled 2017-11-21: qty 1

## 2017-11-21 MED ORDER — PRAVASTATIN SODIUM 20 MG PO TABS
40.0000 mg | ORAL_TABLET | Freq: Every day | ORAL | Status: DC
  Administered 2017-11-21: 40 mg via ORAL
  Filled 2017-11-21: qty 2

## 2017-11-21 MED ORDER — GABAPENTIN 300 MG PO CAPS
300.0000 mg | ORAL_CAPSULE | Freq: Two times a day (BID) | ORAL | Status: DC
  Administered 2017-11-21 (×2): 300 mg via ORAL
  Filled 2017-11-21 (×2): qty 1

## 2017-11-21 MED ORDER — IBUPROFEN 400 MG PO TABS
400.0000 mg | ORAL_TABLET | Freq: Four times a day (QID) | ORAL | Status: DC | PRN
  Administered 2017-11-21 – 2017-11-22 (×2): 400 mg via ORAL
  Filled 2017-11-21 (×2): qty 1

## 2017-11-21 MED ORDER — VITAMIN B-1 100 MG PO TABS
100.0000 mg | ORAL_TABLET | Freq: Every day | ORAL | Status: DC
  Administered 2017-11-22 – 2017-11-24 (×3): 100 mg via ORAL
  Filled 2017-11-21 (×6): qty 1

## 2017-11-21 MED ORDER — FOLIC ACID 1 MG PO TABS
1.0000 mg | ORAL_TABLET | Freq: Every day | ORAL | Status: DC
  Administered 2017-11-21 – 2017-11-24 (×4): 1 mg via ORAL
  Filled 2017-11-21 (×6): qty 1

## 2017-11-21 MED ORDER — RISPERIDONE 1 MG PO TABS
1.0000 mg | ORAL_TABLET | Freq: Two times a day (BID) | ORAL | Status: DC
  Administered 2017-11-21: 1 mg via ORAL
  Filled 2017-11-21: qty 1

## 2017-11-21 MED ORDER — GABAPENTIN 300 MG PO CAPS
300.0000 mg | ORAL_CAPSULE | Freq: Two times a day (BID) | ORAL | Status: DC
  Administered 2017-11-21 – 2017-11-22 (×2): 300 mg via ORAL
  Filled 2017-11-21 (×5): qty 1

## 2017-11-21 NOTE — BH Assessment (Signed)
[facility] Assessment Progress Note  Per Juanetta Beets, DO, this pt requires psychiatric hospitalization at this time.  Berneice Heinrich, RN, [facility] has assigned pt to [facility] Rm 306-2.  Pt has signed Voluntary Admission and Consent for Treatment, as well as Consent to Release Information to pt's mother, and signed forms have been faxed to [facility].  Pt's nurse has been notified, and agrees to send original paperwork along with pt via [place], and to call report to [phone].  Doylene Canning, [place] [facility] Coordinator [phone]

## 2017-11-21 NOTE — ED Notes (Signed)
Pt requesting to not heat breakfast at this time.

## 2017-11-21 NOTE — H&P (Signed)
Psychiatric Admission Assessment Adult  Patient Identification: Connor Bailey MRN:  [id number] Date of Evaluation:  11/21/2017 Chief Complaint:  mdd Principal Diagnosis: <principal problem not specified> Diagnosis:   Patient Active Problem List   Diagnosis Date Noted  . 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 Alcohol-induced mood disorder (HCC) [F10.94]   . Cough [R05] 11/18/2017  . Essential hypertension [I10] 11/18/2017  . Psychiatric diagnosis [F99] 11/18/2017  . Type 2 diabetes mellitus with hyperlipidemia (HCC) [E11.69, E78.5] 11/18/2017  . Renal insufficiency [N28.9]   . Diabetic ketoacidosis without coma associated with type 2 diabetes mellitus (HCC) [E11.10] 11/16/2017   History of Present Illness: Patient is seen and examined.  Patient is a 42 year old male with a past psychiatric history significant for unspecified psychotic illness, alcohol use disorder/dependence, history of opioid use/dependence, reported history of depression with psychotic features, poorly controlled type 2 diabetes mellitus who presented to the [facility] emergency department on 11/16/2017 originally with upper respiratory tract symptoms.  He had moved from the [place] area and had to come here to be in a rehabilitation facility.  During the work-up he was found to be in diabetic ketoacidosis, and was hospitalized.  He was discharged on 11/18/2017.  He was to secure a ride back to his sober living house.  On 10/24 he really presented to the [facility] emergency department with suicidal ideation.  He was evaluated then and stated that he was suicidal.  He stated he would do "the easiest thing".  The patient reported drinking daily and he would drink 40 ounce beers.  He also had been hospitalized on 11/07/2017 at the violent health facility in [place].  He was hospitalized there from 1011 03/09/2016.  At that time he  reported auditory hallucinations, anxiety, depression and suicidal ideation.  He was reportedly trying to stick things in electrical outlets in an attempt to kill himself.  He was discharged there on 11/14/2017.  He apparently was only out of hospital treatment for 2 days before presenting to the [facility] admission as noted above.  [facility] also [facility] to 10/21/2017.  He presented at that time with a history of bipolar disorder, polysubstance use disorder and alcohol withdrawal.  He stated he was having suicidal thoughts with no plan but did state he attempted to overdose on methamphetamines a few weeks prior to admission.  It also sounded as though he had extraparametal side effects to the risk for doll and had some drooling.  He had some aggressive behavior at that time as well.  From a psychiatric perspective he was discharged on Risperdal, hydroxyzine, folic acid.  He was admitted to our facility for evaluation and stabilization.  Associated Signs/Symptoms: Depression Symptoms:  depressed mood, anhedonia, insomnia, psychomotor agitation, fatigue, feelings of worthlessness/guilt, difficulty concentrating, hopelessness, impaired memory, suicidal thoughts without plan, anxiety, panic attacks, loss of energy/fatigue, disturbed sleep, (Hypo) Manic Symptoms:  Impulsivity, Irritable Mood, Labiality of Mood, Anxiety Symptoms:  Excessive Worry, Psychotic Symptoms:  Denied PTSD Symptoms: Negative Total Time spent with patient: 30 minutes  Past Psychiatric History: Although the patient stated he had only been psychiatrically hospitalized on 2 occasions it appears as though he has had more than that.  According to the paperwork she has been previously diagnosed with bipolar disorder, depression, generalized anxiety disorder, chronic pain, opiate dependence, alcohol dependence.  He most recently has been on the risperidone.  He also  appears to  potentially have tardive dyskinesia.  Is the patient at risk to self? Yes.    Has the patient been a risk to self in the past 6 months? Yes.    Has the patient been a risk to self within the distant past? Yes.    Is the patient a risk to others? No.  Has the patient been a risk to others in the past 6 months? No.  Has the patient been a risk to others within the distant past? No.   Prior Inpatient Therapy:   Prior Outpatient Therapy:    Alcohol Screening:   Substance Abuse History in the last 12 months:  Yes.   Consequences of Substance Abuse: Withdrawal Symptoms:   Headaches Nausea Tremors Vomiting Previous Psychotropic Medications: Yes  Psychological Evaluations: Yes  Past Medical History:  Past Medical History:  Diagnosis Date  . Diabetes mellitus without complication [facility])     Past Surgical History:  Procedure Laterality Date  . CLAVICLE SURGERY     Family History:  Family History  Problem Relation Age of Onset  . Fibromyalgia Mother    Family Psychiatric  History: Denied Tobacco Screening:   Social History:  Social History   Substance and Sexual Activity  Alcohol Use Never  . Frequency: Never     Social History   Substance and Sexual Activity  Drug Use Never    Additional Social History:                           Allergies:  No Known Allergies Lab Results:  Results for orders placed or performed during the hospital encounter of 11/21/17 (from the past 48 hour(s))  Glucose, capillary     Status: Abnormal   Collection Time: 11/21/17  2:17 PM  Result Value Ref Range   Glucose-Capillary 255 (H) 70 - 99 mg/dL   Comment 1 Notify RN    Comment 2 Document in Chart     Blood Alcohol level:  Lab Results  Component Value Date   ETH 37 (H) 11/20/2017    Metabolic Disorder Labs:  Lab Results  Component Value Date   HGBA1C 11.1 (H) 11/17/2017   MPG 272 11/17/2017   No results found for: PROLACTIN No results found for: CHOL, TRIG, HDL,  CHOLHDL, VLDL, LDLCALC  Current Medications: Current Facility-Administered Medications  Medication Dose Route Frequency Provider Last Rate Last Dose  . acetaminophen (TYLENOL) 500 MG tablet           . benzonatate (TESSALON) capsule 100 mg  100 mg Oral BID PRN Rankin, Shuvon B, NP      . benztropine (COGENTIN) tablet 0.5 mg  0.5 mg Oral BID Antonieta Pert, MD      . chlordiazePOXIDE (LIBRIUM) 25 MG capsule           . chlordiazePOXIDE (LIBRIUM) capsule 25 mg  25 mg Oral Q6H PRN Rankin, Shuvon B, NP   25 mg at 11/21/17 1443  . folic acid (FOLVITE) tablet 1 mg  1 mg Oral Daily Antonieta Pert, MD      . gabapentin (NEURONTIN) capsule 300 mg  300 mg Oral BID Rankin, Shuvon B, NP      . hydrOXYzine (ATARAX/VISTARIL) tablet 25 mg  25 mg Oral Q6H PRN Rankin, Shuvon B, NP      . ibuprofen (ADVIL,MOTRIN) tablet 400 mg  400 mg Oral Q6H PRN Antonieta Pert, MD      . insulin  aspart (novoLOG) injection 0-15 Units  0-15 Units Subcutaneous TID WC Rankin, Shuvon B, NP      . insulin glargine (LANTUS) injection 39 Units  39 Units Subcutaneous QHS Rankin, Shuvon B, NP      . [START ON 11/22/2017] lisinopril (PRINIVIL,ZESTRIL) tablet 20 mg  20 mg Oral Daily Rankin, Shuvon B, NP      . loperamide (IMODIUM) capsule 2-4 mg  2-4 mg Oral PRN Rankin, Shuvon B, NP      . multivitamin with minerals tablet 1 tablet  1 tablet Oral Daily Rankin, Shuvon B, NP   1 tablet at 11/21/17 1443  . ondansetron (ZOFRAN-ODT) disintegrating tablet 4 mg  4 mg Oral Q6H PRN Rankin, Shuvon B, NP      . pantoprazole (PROTONIX) EC tablet 40 mg  40 mg Oral Daily Antonieta Pert, MD      . pravastatin (PRAVACHOL) tablet 40 mg  40 mg Oral QHS Rankin, Shuvon B, NP      . risperiDONE (RISPERDAL) tablet 2 mg  2 mg Oral BID Antonieta Pert, MD      . Melene Muller ON 11/22/2017] thiamine (VITAMIN B-1) tablet 100 mg  100 mg Oral Daily Rankin, Shuvon B, NP       PTA Medications: Medications Prior to Admission  Medication Sig Dispense  Refill Last Dose  . benzonatate (TESSALON) 100 MG capsule Take 1 capsule (100 mg total) by mouth 2 (two) times daily as needed for cough. 20 capsule 0   . benztropine (COGENTIN) 1 MG tablet Take 1 mg by mouth 2 (two) times daily.   0 Past Week at Unknown time  . folic acid (FOLVITE) 1 MG tablet Take 1 mg by mouth daily.  3 Past Week at Unknown time  . gabapentin (NEURONTIN) 300 MG capsule Take 300 mg by mouth 2 (two) times daily.   0 Past Week at Unknown time  . guaiFENesin-dextromethorphan (ROBITUSSIN DM) 100-10 MG/5ML syrup Take 5 mLs by mouth every 4 (four) hours as needed for cough. 118 mL 0   . hydrOXYzine (VISTARIL) 25 MG capsule Take 25 mg by mouth every 6 (six) hours as needed for anxiety.   0 unknown at Unknown time  . insulin aspart (NOVOLOG) 100 UNIT/ML injection Inject 0-10 Units into the skin 3 (three) times daily before meals.   Past Week at Unknown time  . insulin glargine (LANTUS) 100 UNIT/ML injection Inject 39 Units into the skin at bedtime.   Past Week at Unknown time  . lisinopril (PRINIVIL,ZESTRIL) 20 MG tablet Take 1 tablet (20 mg total) by mouth daily.     . pravastatin (PRAVACHOL) 40 MG tablet Take 40 mg by mouth at bedtime.   0 Past Week at Unknown time  . risperiDONE (RISPERDAL) 3 MG tablet Take 3 mg by mouth 2 (two) times daily.   0 Past Week at Unknown time  . traMADol (ULTRAM) 50 MG tablet Take 50 mg by mouth every 6 (six) hours as needed for moderate pain or severe pain.   0 unknown  . traZODone (DESYREL) 100 MG tablet Take 200 mg by mouth at bedtime.   0 Past Week at Unknown time    Musculoskeletal: Strength & Muscle Tone: within normal limits Gait & Station: normal Patient leans: N/A  Psychiatric Specialty Exam: Physical Exam  Nursing note and vitals reviewed. Constitutional: He is oriented to person, place, and time. He appears well-developed and well-nourished.  HENT:  Head: Normocephalic and atraumatic.  Respiratory: Effort normal.  Neurological:  He is  alert and oriented to person, place, and time.    ROS  There were no vitals taken for this visit.There is no height or weight on file to calculate BMI.  General Appearance: Disheveled  Eye Contact:  Fair  Speech:  Normal Rate  Volume:  Increased  Mood:  Anxious, Depressed and Irritable  Affect:  Congruent  Thought Process:  Coherent and Descriptions of Associations: Circumstantial  Orientation:  Full (Time, Place, and Person)  Thought Content:  Logical  Suicidal Thoughts:  Yes.  without intent/plan  Homicidal Thoughts:  No  Memory:  Immediate;   Poor Recent;   Poor Remote;   Poor  Judgement:  Impaired  Insight:  Lacking  Psychomotor Activity:  Increased  Concentration:  Concentration: Poor and Attention Span: Poor  Recall:  Poor  Fund of Knowledge:  Fair  Language:  Fair  Akathisia:  Negative  Handed:  Right  AIMS (if indicated):     Assets:  Desire for Improvement Resilience  ADL's:  Intact  Cognition:  WNL  Sleep:       Treatment Plan Summary: Daily contact with patient to assess and evaluate symptoms and progress in treatment, Medication management and Plan :Patient is seen and examined.  Patient is a 42 year old male with the above-stated past psychiatric history who presented with suicidal ideation and reported alcohol dependency.  It appears as though he has been in the hospital essentially since early September.  He has been admitted to the hospital on 9/5, 9/16, 10/11, 10/20 and today.  I think the chances of him going into significant alcohol withdrawal are fairly low given the fact that he is only been out of the hospital a couple of days of the time.  We will have a PRN of Librium available for him if he fulfills criteria of a CIWA greater than 10.  Unfortunately it also appears he eats though he has tardive dyskinesia.  I we will continue the Cogentin to see if it is cyst with that.  We will continue the Risperdal 2 mg p.o. twice daily given his agitation and  complaints of psychosis in the past.  He will also have available folic acid, hydroxyzine and is complaining of pain.  We will start Motrin 400 mg p.o. every 6 hours as needed pain and is well with that we will start him on Protonix for gastric protection.  It also sounds as though he is gone from 1 rehabilitation facility to another, and I think we need to clarify where his disposition will be to.  His diabetes mellitus sounds like it has been poorly controlled for a while.  We will continue the insulin dosage as previously recommended.  We will check his blood sugar 4 times daily.  Observation Level/Precautions:  Detox 15 minute checks Seizure  Laboratory:  Chemistry Profile  Psychotherapy:    Medications:    Consultations:    Discharge Concerns:    Estimated LOS:  Other:     Physician Treatment Plan for Primary Diagnosis: <principal problem not specified> Long Term Goal(s): Improvement in symptoms so as ready for discharge  Short Term Goals: Ability to identify changes in lifestyle to reduce recurrence of condition will improve, Ability to verbalize feelings will improve, Ability to disclose and discuss suicidal ideas, Ability to demonstrate self-control will improve, Ability to identify and develop effective coping behaviors will improve, Ability to maintain clinical measurements within normal limits will improve, Compliance with prescribed medications will improve and Ability to identify triggers  associated with substance abuse/mental health issues will improve  Physician Treatment Plan for Secondary Diagnosis: Active Problems:   MDD (major depressive disorder), recurrent, severe, with psychosis (HCC)  Long Term Goal(s): Improvement in symptoms so as ready for discharge  Short Term Goals: Ability to identify changes in lifestyle to reduce recurrence of condition will improve, Ability to verbalize feelings will improve, Ability to disclose and discuss suicidal ideas, Ability to demonstrate  self-control will improve, Ability to identify and develop effective coping behaviors will improve, Ability to maintain clinical measurements within normal limits will improve, Compliance with prescribed medications will improve and Ability to identify triggers associated with substance abuse/mental health issues will improve  I certify that inpatient services furnished can reasonably be expected to improve the patient's condition.    Antonieta Pert, MD 10/25/20193:47 PM

## 2017-11-21 NOTE — BHH Suicide Risk Assessment (Signed)
[facility] Admission Suicide Risk Assessment   Nursing information obtained from:    Demographic factors:    Current Mental Status:    Loss Factors:    Historical Factors:    Risk Reduction Factors:     Total Time spent with patient: 30 minutes Principal Problem: <principal problem not specified> Diagnosis:   Patient Active Problem List   Diagnosis Date Noted  . 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 Alcohol-induced mood disorder (HCC) [F10.94]   . Cough [R05] 11/18/2017  . Essential hypertension [I10] 11/18/2017  . Psychiatric diagnosis [F99] 11/18/2017  . Type 2 diabetes mellitus with hyperlipidemia (HCC) [E11.69, E78.5] 11/18/2017  . Renal insufficiency [N28.9]   . Diabetic ketoacidosis without coma associated with type 2 diabetes mellitus (HCC) [E11.10] 11/16/2017   Subjective Data: Patient is seen and examined.  Patient is a 42 year old male with a past psychiatric history significant for unspecified psychotic illness, alcohol use disorder/dependence, history of opioid use/dependence, reported history of depression with psychotic features, poorly controlled type 2 diabetes mellitus who presented to the [facility] emergency department on 11/16/2017 originally with upper respiratory tract symptoms.  He had moved from the [place] area and had to come here to be in a rehabilitation facility.  During the work-up he was found to be in diabetic ketoacidosis, and was hospitalized.  He was discharged on 11/18/2017.  He was to secure a ride back to his sober living house.  On 10/24 he really presented to the [facility] emergency department with suicidal ideation.  He was evaluated then and stated that he was suicidal.  He stated he would do "the easiest thing".  The patient reported drinking daily and he would drink 40 ounce beers.  He also had been hospitalized on 11/07/2017 at the violent health facility in  [place].  He was hospitalized there from 1011 03/09/2016.  At that time he reported auditory hallucinations, anxiety, depression and suicidal ideation.  He was reportedly trying to stick things in electrical outlets in an attempt to kill himself.  He was discharged there on 11/14/2017.  He apparently was only out of hospital treatment for 2 days before presenting to the [facility] admission as noted above.  He also had [facility] to 10/21/2017.  He presented at that time with a history of bipolar disorder, polysubstance use disorder and alcohol withdrawal.  He stated he was having suicidal thoughts with no plan but did state he attempted to overdose on methamphetamines a few weeks prior to admission.  It also sounded as though he had extraparametal side effects to the risk for doll and had some drooling.  He had some aggressive behavior at that time as well.  From a psychiatric perspective he was discharged on Risperdal, hydroxyzine, folic acid.  He was admitted to our facility for evaluation and stabilization.  Continued Clinical Symptoms:    The "Alcohol Use Disorders Identification Test", Guidelines for Use in Primary Care, Second Edition.  World Science writer [facility]). Score between 0-7:  no or low risk or alcohol related problems. Score between 8-15:  moderate risk of alcohol related problems. Score between 16-19:  high risk of alcohol related problems. Score 20 or above:  warrants further diagnostic evaluation for alcohol dependence and treatment.   CLINICAL FACTORS:   Bipolar Disorder:   Depressive phase Depression:   Aggression Anhedonia Comorbid alcohol abuse/dependence Hopelessness Impulsivity Insomnia Alcohol/Substance Abuse/Dependencies  Musculoskeletal: Strength & Muscle Tone: within normal limits Gait & Station: normal Patient leans: N/A  Psychiatric Specialty Exam: Physical Exam  Nursing note and vitals  reviewed. Constitutional: He is oriented to person, place, and time. He appears well-developed and well-nourished.  HENT:  Head: Normocephalic and atraumatic.  Respiratory: Effort normal.  Neurological: He is alert and oriented to person, place, and time.    ROS  There were no vitals taken for this visit.There is no height or weight on file to calculate BMI.  General Appearance: Disheveled  Eye Contact:  Fair  Speech:  Normal Rate  Volume:  Increased  Mood:  Anxious, Dysphoric and Irritable  Affect:  Congruent  Thought Process:  Coherent and Descriptions of Associations: Circumstantial  Orientation:  Full (Time, Place, and Person)  Thought Content:  Logical  Suicidal Thoughts:  Yes.  without intent/plan  Homicidal Thoughts:  No  Memory:  Immediate;   Poor Recent;   Poor Remote;   Poor  Judgement:  Impaired  Insight:  Lacking  Psychomotor Activity:  Increased  Concentration:  Concentration: Fair and Attention Span: Fair  Recall:  Poor  Fund of Knowledge:  Fair  Language:  Fair  Akathisia:  Negative  Handed:  Right  AIMS (if indicated):     Assets:  Desire for Improvement Resilience  ADL's:  Intact  Cognition:  WNL  Sleep:         COGNITIVE FEATURES THAT CONTRIBUTE TO RISK:  Thought constriction (tunnel vision)    SUICIDE RISK:   Minimal: No identifiable suicidal ideation.  Patients presenting with no risk factors but with morbid ruminations; may be classified as minimal risk based on the severity of the depressive symptoms  PLAN OF CARE: Patient is seen and examined.  Patient is a 42 year old male with the above-stated past psychiatric history who presented with suicidal ideation and reported alcohol dependency.  It appears as though he has been in the hospital essentially since early September.  He has been admitted to the hospital on 9/5, 9/16, 10/11, 10/20 and today.  I think the chances of him going into significant alcohol withdrawal are fairly low given the fact  that he is only been out of the hospital a couple of days of the time.  We will have a PRN of Librium available for him if he fulfills criteria of a CIWA greater than 10.  Unfortunately it also appears he eats though he has tardive dyskinesia.  I we will continue the Cogentin to see if it is cyst with that.  We will continue the Risperdal 2 mg p.o. twice daily given his agitation and complaints of psychosis in the past.  He will also have available folic acid, hydroxyzine and is complaining of pain.  We will start Motrin 400 mg p.o. every 6 hours as needed pain and is well with that we will start him on Protonix for gastric protection.  It also sounds as though he is gone from 1 rehabilitation facility to another, and I think we need to clarify where his disposition will be to.  His diabetes mellitus sounds like it has been poorly controlled for a while.  We will continue the insulin dosage as previously recommended.  We will check his blood sugar 4 times daily.  I certify that inpatient services furnished can reasonably be expected to improve the patient's condition.   Antonieta Pert, MD 11/21/2017, 3:16 PM

## 2017-11-21 NOTE — ED Notes (Addendum)
Pt sts he can feel his blood sugar is low, asking for snacks. CBG 55, orange juice and peanut butter/graham crackers provided to pt. Will recheck BG in 30 minutes. Pt also reports he is getting headache, feeling  anxious, jittery sts not from low blood sugar, but as if he is starting to go through withdrawals, ativan given as per protocol.
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
## 2017-11-21 NOTE — Progress Notes (Signed)
D: Pt was at nurse's station upon initial approach.  He is smacking his lips and is restless.  He presents with anxious affect and mood.  He reports he feels "like shit, anxious."  Pt states "I need something to help me rest."  He reports he normally takes Trazodone 200 mg PO at bedtime and on-site provider notified.  Pt reports SI with a plan of "the easiest way possible."  He verbally contracts for safety at [facility].  He denies HI and hallucinations at this time.  Pt complains of generalized pain of 7/10.  Pt has been interacting with peers and staff appropriately.  He attended evening group.  Introduced self to pt.  Actively listened to pt and provided support and encouragement.  On-site provider notified of pt's behaviors and requests.  New medications were ordered and administered, see eMAR.  PRN pain medication administered.  Medication administered per order.  Q15 minute safety checks maintained.  Fall prevention techniques reviewed with pt and he verbalized understanding.    Pt is compliant with medications.  He verbally contracts for safety and reports he will inform staff of needs and concerns.  Will continue to monitor and assess.
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
## 2017-11-21 NOTE — Tx Team (Addendum)
Initial Treatment Plan 11/21/2017 3:11 PM Connor Bailey [id number]    PATIENT STRESSORS: Financial difficulties Health problems Legal issue Medication change or noncompliance Occupational concerns Substance abuse   PATIENT STRENGTHS: Ability for insight Average or above average intelligence Physical Health   PATIENT IDENTIFIED PROBLEMS: Depression with SI  Suicide attempt  Chronic Pain  Diabetes Mellitus        "When can I get something for pain"  " When can I leave"     DISCHARGE CRITERIA:  Ability to meet basic life and health needs Improved stabilization in mood, thinking, and/or behavior Medical problems require only outpatient monitoring  PRELIMINARY DISCHARGE PLAN: Attend aftercare/continuing care group Attend PHP/IOP Outpatient therapy  PATIENT/FAMILY INVOLVEMENT: This treatment plan has been presented to and reviewed with the patient, Connor Bailey, and/or family member, .  The patient and family have been given the opportunity to ask questions and make suggestions.  Rich Brave, RN 11/21/2017, 3:11 PM

## 2017-11-21 NOTE — ED Notes (Signed)
[facility] notified need of transport to [facility]

## 2017-11-21 NOTE — ED Notes (Signed)
[facility] on unit to transfer pt to [facility] per MD order. Personal property returned. Pt signed e-signature. Ambulatory off unit.

## 2017-11-21 NOTE — ED Notes (Signed)
Assumed care pf pt who is resting with eyes closed, breathing unlabored, sitter at bedside.

## 2017-11-21 NOTE — ED Notes (Signed)
Allen MD notified need of sliding scale for insulin order

## 2017-11-22 LAB — GLUCOSE, CAPILLARY
GLUCOSE-CAPILLARY: 171 mg/dL — AB (ref 70–99)
GLUCOSE-CAPILLARY: 466 mg/dL — AB (ref 70–99)
Glucose-Capillary: 162 mg/dL — ABNORMAL HIGH (ref 70–99)
Glucose-Capillary: 257 mg/dL — ABNORMAL HIGH (ref 70–99)

## 2017-11-22 LAB — CULTURE, BLOOD (ROUTINE X 2)
Culture: NO GROWTH
SPECIAL REQUESTS: ADEQUATE

## 2017-11-22 MED ORDER — PROPRANOLOL HCL 40 MG PO TABS
40.0000 mg | ORAL_TABLET | Freq: Two times a day (BID) | ORAL | Status: DC
  Administered 2017-11-22 – 2017-11-23 (×2): 40 mg via ORAL
  Filled 2017-11-22 (×6): qty 1
  Filled 2017-11-22: qty 4

## 2017-11-22 MED ORDER — CHLORDIAZEPOXIDE HCL 25 MG PO CAPS: 50.0000 mg | ORAL_CAPSULE | Freq: Four times a day (QID) | ORAL | Status: DC | PRN

## 2017-11-22 MED ORDER — LISINOPRIL 20 MG PO TABS: 40.0000 mg | ORAL_TABLET | Freq: Every day | ORAL | Status: DC

## 2017-11-22 MED ORDER — INSULIN ASPART 100 UNIT/ML ~~LOC~~ SOLN
16.0000 [IU] | Freq: Once | SUBCUTANEOUS | Status: AC
  Administered 2017-11-22: 16 [IU] via SUBCUTANEOUS

## 2017-11-22 MED ORDER — IBUPROFEN 600 MG PO TABS
600.0000 mg | ORAL_TABLET | Freq: Four times a day (QID) | ORAL | Status: DC | PRN
  Administered 2017-11-22 – 2017-11-23 (×4): 600 mg via ORAL
  Filled 2017-11-22 (×4): qty 1

## 2017-11-22 MED ORDER — ZIPRASIDONE MESYLATE 20 MG IM SOLR
INTRAMUSCULAR | Status: AC
  Filled 2017-11-22: qty 20

## 2017-11-22 MED ORDER — PHENOBARBITAL SODIUM 65 MG/ML IJ SOLN
32.5000 mg | Freq: Four times a day (QID) | INTRAMUSCULAR | Status: DC | PRN
  Administered 2017-11-22: 32.5 mg via INTRAMUSCULAR
  Filled 2017-11-22 (×2): qty 0.5

## 2017-11-22 MED ORDER — PHENOBARBITAL 32.4 MG PO TABS
32.4000 mg | ORAL_TABLET | Freq: Four times a day (QID) | ORAL | Status: DC | PRN
Start: 2017-11-22 — End: 2017-11-23

## 2017-11-22 MED ORDER — BENZTROPINE MESYLATE 1 MG PO TABS
1.0000 mg | ORAL_TABLET | Freq: Two times a day (BID) | ORAL | Status: DC
  Administered 2017-11-22 – 2017-11-23 (×2): 1 mg via ORAL
  Filled 2017-11-22 (×7): qty 1

## 2017-11-22 MED ORDER — LORAZEPAM 1 MG PO TABS
ORAL_TABLET | ORAL | Status: AC
  Filled 2017-11-22: qty 1

## 2017-11-22 MED ORDER — PHENOBARBITAL SODIUM 65 MG/ML IJ SOLN: 32.4000 mg | Freq: Once | INTRAMUSCULAR | Status: DC

## 2017-11-22 MED ORDER — ZIPRASIDONE MESYLATE 20 MG IM SOLR
10.0000 mg | Freq: Once | INTRAMUSCULAR | Status: AC
  Administered 2017-11-22: 10 mg via INTRAMUSCULAR
  Filled 2017-11-22: qty 20

## 2017-11-22 MED ORDER — GABAPENTIN 400 MG PO CAPS
400.0000 mg | ORAL_CAPSULE | Freq: Two times a day (BID) | ORAL | Status: DC
  Administered 2017-11-22 – 2017-11-23 (×2): 400 mg via ORAL
  Filled 2017-11-22 (×7): qty 1

## 2017-11-22 MED ORDER — PHENOBARBITAL SODIUM 65 MG/ML IJ SOLN: 32.4000 mg | Freq: Four times a day (QID) | INTRAMUSCULAR | Status: DC | PRN

## 2017-11-22 MED ORDER — TRAZODONE HCL 100 MG PO TABS
200.0000 mg | ORAL_TABLET | Freq: Every evening | ORAL | Status: DC | PRN
  Administered 2017-11-22 – 2017-11-23 (×4): 200 mg via ORAL
  Filled 2017-11-22 (×3): qty 2
  Filled 2017-11-22: qty 14
  Filled 2017-11-22: qty 2
  Filled 2017-11-22: qty 14
  Filled 2017-11-22 (×3): qty 2

## 2017-11-22 MED ORDER — BUSPIRONE HCL 10 MG PO TABS
10.0000 mg | ORAL_TABLET | Freq: Three times a day (TID) | ORAL | Status: DC
  Administered 2017-11-22 – 2017-11-23 (×3): 10 mg via ORAL
  Filled 2017-11-22 (×2): qty 1
  Filled 2017-11-22 (×2): qty 2
  Filled 2017-11-22 (×7): qty 1

## 2017-11-22 MED ORDER — LISINOPRIL 40 MG PO TABS
40.0000 mg | ORAL_TABLET | Freq: Every day | ORAL | Status: DC
  Filled 2017-11-22: qty 1

## 2017-11-22 MED ORDER — PHENOBARBITAL SODIUM 65 MG/ML IJ SOLN: 32.5000 mg | Freq: Four times a day (QID) | INTRAMUSCULAR | Status: DC | PRN

## 2017-11-22 MED ORDER — CHLORDIAZEPOXIDE HCL 25 MG PO CAPS
25.0000 mg | ORAL_CAPSULE | Freq: Once | ORAL | Status: AC
  Administered 2017-11-22: 25 mg via ORAL

## 2017-11-22 MED ORDER — PHENOBARBITAL 32.4 MG PO TABS: 32.4000 mg | ORAL_TABLET | Freq: Four times a day (QID) | ORAL | Status: DC | PRN

## 2017-11-22 MED ORDER — CHLORDIAZEPOXIDE HCL 25 MG PO CAPS
50.0000 mg | ORAL_CAPSULE | Freq: Four times a day (QID) | ORAL | Status: DC | PRN
  Administered 2017-11-22 – 2017-11-23 (×3): 50 mg via ORAL
  Filled 2017-11-22 (×3): qty 2

## 2017-11-22 MED ORDER — NICOTINE 21 MG/24HR TD PT24
21.0000 mg | MEDICATED_PATCH | Freq: Every day | TRANSDERMAL | Status: DC
  Administered 2017-11-22 – 2017-11-23 (×2): 21 mg via TRANSDERMAL
  Filled 2017-11-22 (×5): qty 1

## 2017-11-22 MED ORDER — PHENOBARBITAL 32.4 MG PO TABS
32.4000 mg | ORAL_TABLET | Freq: Once | ORAL | Status: DC
  Filled 2017-11-22: qty 1
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
## 2017-11-22 NOTE — Progress Notes (Signed)
Patient did attend the evening speaker AA meeting. Pt exited group to ask for medication, returned to group, then left group fifteen minutes early and took a shower.

## 2017-11-22 NOTE — Progress Notes (Signed)
Patient agitated, yelling, punching walls and bed. He is threatening towards staff and stating that he can't overcome his anxiety at this time. He states anxiety is his main concern, and feels we are not doing enough to address it. He was not redirectable by staff verbal de-escalation, but he did agree to go to the open quiet room without physical escort. No restraints required. Medication ordered, patient agreeable to waiting for it to come from [facility] pharmacy. Medications administered. Patient resting in bed.

## 2017-11-22 NOTE — [facility] Group Notes (Signed)
LCSW Group Therapy Note  11/22/2017   10:00-11:00am   Type of Therapy and Topic:  Group Therapy: Anger Cues and Responses  Participation Level:  Minimal   Description of Group:   In this group, patients learned how to recognize the physical, cognitive, emotional, and behavioral responses they have to anger-provoking situations.  They identified a recent time they became angry and how they reacted.  They analyzed how their reaction was possibly beneficial and how it was possibly unhelpful.  The group discussed a variety of healthier coping skills that could help with such a situation in the future.  Deep breathing was practiced briefly.  Therapeutic Goals: 1. Patients will remember their last incident of anger and how they felt emotionally and physically, what their thoughts were at the time, and how they behaved. 2. Patients will identify how their behavior at that time worked for them, as well as how it worked against them. 3. Patients will explore possible new behaviors to use in future anger situations. 4. Patients will learn that anger itself is normal and cannot be eliminated, and that healthier reactions can assist with resolving conflict rather than worsening situations.  Summary of Patient Progress:  The patient shared that his most recent time of anger was yesterday and said he feels that the thing that needs to change in his life is himself, but he does not know how.  He was in and out of the room due to being called out to speak with a provider.  He was very physically agitated and up/down throughout the time he was present.  Therapeutic Modalities:   Cognitive Behavioral Therapy  Lynnell Chad
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
## 2017-11-22 NOTE — Progress Notes (Signed)
Patient signed 72 hour request for discharge today at 1725. Patient stating "that doctor is a Prick, and I am supposed to be on tramadol." Patient displaying significant substance abuse cravings, requesting "any medication you can give me." Patient having multiple somatic complaints requiring PRN medication administration. He is demanding of staff and requesting to be discharged "as soon as you can get me out of here."

## 2017-11-22 NOTE — Progress Notes (Signed)
[facility] MD Progress Note  11/22/2017 11:18 AM Connor Bailey  MRN:  [id number] Subjective:  Patient is seen and examined. Patient is a 42 year old male with a past psychiatric history significant for unspecified psychotic illness, alcohol use disorder/dependence, history of opioid use/dependence, reported history of depression with psychotic features, poorly controlled type 2 diabetes mellitus who presented to the [facility] emergency department on 11/16/2017 originally with upper respiratory tract symptoms. He had moved from the [place] and had to come here to be in a rehabilitation facility. During the work-up he was found to be in diabetic ketoacidosis, and was hospitalized. He was discharged on 11/18/2017. He was to secure a ride back to his sober living house. On 10/24 he really presented to the [facility] emergency department with suicidal ideation. He was evaluated then and stated that he was suicidal. He stated he would do "the easiest thing". The patient reported drinking daily and he would drink 40 ounce beers. He also had been hospitalized on 11/07/2017 at the violent health facility in [place]. He was hospitalized there from 1011 03/09/2016. At that time he reported auditory hallucinations, anxiety, depression and suicidal ideation. He was reportedly trying to stick things in electrical outlets in an attempt to kill himself. He was discharged there on 11/14/2017. He apparently was only out of hospital treatment for 2 days before presenting to the [facility] admission as noted above. He also had been [facility] on 10/13/2017 to 10/21/2017. He presented at that time with a history of bipolar disorder, polysubstance use disorder and alcohol withdrawal. He stated he was having suicidal thoughts with no plan but did state he attempted to overdose on methamphetamines a few weeks prior to admission. It also sounded as  though he had extraparametal side effects to the risk for doll and had some drooling. He had some aggressive behavior at that time as well. From a psychiatric perspective he was discharged on Risperdal, hydroxyzine, folic acid. He was admitted to our facility for evaluation and stabilization.  Objective: Patient is seen and examined.  Patient is a 42 year old male with the above-stated past psychiatric history seen in follow-up.  He continues to be significantly irritable and drug-seeking.  He continues to demand medications for anxiety.  He was threatening to have an agitation episode, and I agreed to give him 10 mg of Geodon IM.  He admitted to auditory hallucinations, but I am not really sure what they are secondary to.  He stated he has to get some sleep.  Nursing notes reflected that he slept 5 hours last night.  The patient stated he wanted to sleep during the daytime, and I told him that by sleeping during the day he would inhibit his sleep at night 9 encouraged him to attend groups.  He was not happy for this.  He stated after discharge she would return to the substance abuse treatment program he had been in.  He stated he had not used drugs in several months, and his alcohol on admission was 54.  This a.m. his blood pressure was elevated at 167/96.  He was afebrile.  His previous blood pressures have been elevated as well.  He admitted to suicidal ideation.  He admitted to auditory and visual hallucinations.  He continues on CIWA >10 to trigger 25 mg Librium dosage.  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    Principal Problem: <principal problem not specified> Diagnosis:   Patient Active Problem List   Diagnosis Date Noted  . 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 Alcohol-induced mood disorder (HCC)  [F10.94]   . Suicidal ideation [R45.851]   . Cough [R05] 11/18/2017  . Essential hypertension [I10] 11/18/2017  . Psychiatric diagnosis [F99] 11/18/2017  . Type 2 diabetes mellitus with hyperlipidemia (HCC) [E11.69, E78.5] 11/18/2017  . Renal insufficiency [N28.9]   . Diabetic ketoacidosis without coma associated with type 2 diabetes mellitus (HCC) [E11.10] 11/16/2017   Total Time spent with patient: 15 minutes  Past Psychiatric History: See admission H&P  Past Medical History:  Past Medical History:  Diagnosis Date  . Diabetes mellitus without complication [facility])     Past Surgical History:  Procedure Laterality Date  . CLAVICLE SURGERY     Family History:  Family History  Problem Relation Age of Onset  . Fibromyalgia Mother    Family Psychiatric  History: See admission H&P Social History:  Social History   Substance and Sexual Activity  Alcohol Use Yes  . Frequency: Never     Social History   Substance and Sexual Activity  Drug Use Yes  . Frequency: 175.0 times per week  . Types: "Crack" cocaine, Morphine    Social History   Socioeconomic History  . Marital status: Single    Spouse name: unknown  . Number of children: 0  . Years of education: 0  . Highest education level: 9th grade  Occupational History  . Not on file  Social Needs  . Financial resource strain: Not hard at all  . Food insecurity:    Worry: Patient refused    Inability: Patient refused  . Transportation needs:    Medical: Patient refused    Non-medical: Patient refused  Tobacco Use  . Smoking status: Current Every Day Smoker  . Smokeless tobacco: Never Used  Substance and Sexual Activity  . Alcohol use: Yes    Frequency: Never  . Drug use: Yes    Frequency: 175.0 times per week    Types: "Crack" cocaine, Morphine  . Sexual activity: Not Currently    Birth control/protection: None  Lifestyle  . Physical activity:    Days per week: Patient refused    Minutes per session: Patient  refused  . Stress: Not on file  Relationships  . Social connections:    Talks on phone: Not on file    Gets together: Not on file    Attends religious service: Not on file    Active member of club or organization: Not on file    Attends meetings of clubs or organizations: Not on file    Relationship status: Not on file  Other Topics Concern  . Not on file  Social History Narrative  . Not on file   Additional Social History:    Pain Medications: please see mar Prescriptions: please see mar Over the Counter: please see mar History of alcohol / drug use?: Yes Longest period of sobriety (when/how long): unknown Negative Consequences of Use: Financial, Legal Withdrawal Symptoms: DTs, Delirium, Diarrhea, Weakness Name of Substance 1: alcohol 1 - Age of First Use: unknown 1 - Amount (size/oz): "as much as I can" 1 - Frequency: daily 1 - Duration: ongoing 1 - Last Use / Amount: 1024/19                  Sleep:  Fair  Appetite:  Good  Current Medications: Current Facility-Administered Medications  Medication Dose Route Frequency Provider Last Rate Last Dose  . benzonatate (TESSALON) capsule 100 mg  100 mg Oral BID PRN Rankin, Shuvon B, NP   100 mg at 11/22/17 1027  . benztropine (COGENTIN) tablet 1 mg  1 mg Oral BID Antonieta Pert, MD      . busPIRone (BUSPAR) tablet 10 mg  10 mg Oral TID Antonieta Pert, MD      . chlordiazePOXIDE (LIBRIUM) capsule 25 mg  25 mg Oral Q6H PRN Rankin, Shuvon B, NP   25 mg at 11/21/17 1443  . folic acid (FOLVITE) tablet 1 mg  1 mg Oral Daily Antonieta Pert, MD   1 mg at 11/22/17 0949  . gabapentin (NEURONTIN) capsule 400 mg  400 mg Oral BID Antonieta Pert, MD      . hydrOXYzine (ATARAX/VISTARIL) tablet 50 mg  50 mg Oral Q6H PRN Money, Gerlene Burdock, FNP   50 mg at 11/22/17 0424  . ibuprofen (ADVIL,MOTRIN) tablet 400 mg  400 mg Oral Q6H PRN Antonieta Pert, MD   400 mg at 11/21/17 1939  . insulin aspart (novoLOG) injection 0-15  Units  0-15 Units Subcutaneous TID WC Rankin, Shuvon B, NP   3 Units at 11/22/17 2536  . insulin glargine (LANTUS) injection 39 Units  39 Units Subcutaneous QHS Rankin, Shuvon B, NP   39 Units at 11/21/17 2102  . lisinopril (PRINIVIL,ZESTRIL) tablet 20 mg  20 mg Oral Daily Rankin, Shuvon B, NP   20 mg at 11/22/17 0950  . loperamide (IMODIUM) capsule 2-4 mg  2-4 mg Oral PRN Rankin, Shuvon B, NP      . multivitamin with minerals tablet 1 tablet  1 tablet Oral Daily Rankin, Shuvon B, NP   1 tablet at 11/22/17 0950  . ondansetron (ZOFRAN-ODT) disintegrating tablet 4 mg  4 mg Oral Q6H PRN Rankin, Shuvon B, NP      . pantoprazole (PROTONIX) EC tablet 40 mg  40 mg Oral Daily Antonieta Pert, MD   40 mg at 11/22/17 0949  . pravastatin (PRAVACHOL) tablet 40 mg  40 mg Oral QHS Rankin, Shuvon B, NP   40 mg at 11/21/17 2103  . risperiDONE (RISPERDAL) tablet 2 mg  2 mg Oral BID Antonieta Pert, MD   2 mg at 11/22/17 0950  . thiamine (VITAMIN B-1) tablet 100 mg  100 mg Oral Daily Rankin, Shuvon B, NP   100 mg at 11/22/17 0950  . traZODone (DESYREL) tablet 200 mg  200 mg Oral QHS,MR X 1 Silvia Markuson, Marlane Mingle, MD      . ziprasidone (GEODON) 20 MG injection           . ziprasidone (GEODON) injection 10 mg  10 mg Intramuscular Once Antonieta Pert, MD      . ziprasidone (GEODON) injection 20 mg  20 mg Intramuscular Q12H PRN Antonieta Pert, MD   20 mg at 11/21/17 1839    Lab Results:  Results for orders placed or performed during the hospital encounter of 11/21/17 (from the past 48 hour(s))  Glucose, capillary     Status: Abnormal   Collection Time: 11/21/17  2:17 PM  Result Value Ref Range   Glucose-Capillary 255 (H) 70 - 99 mg/dL   Comment 1 Notify RN    Comment 2 Document in Chart   Glucose, capillary     Status: Abnormal   Collection Time: 11/21/17  5:13 PM  Result Value Ref Range   Glucose-Capillary 355 (H) 70 - 99 mg/dL   Comment 1 Notify RN    Comment 2 Document in Chart   Glucose,  capillary     Status: Abnormal   Collection Time: 11/21/17  8:50 PM  Result Value Ref Range   Glucose-Capillary 222 (H) 70 - 99 mg/dL  Glucose, capillary     Status: Abnormal   Collection Time: 11/22/17  6:02 AM  Result Value Ref Range   Glucose-Capillary 162 (H) 70 - 99 mg/dL   Comment 1 Notify RN    Comment 2 Document in Chart     Blood Alcohol level:  Lab Results  Component Value Date   ETH 37 (H) 11/20/2017    Metabolic Disorder Labs: Lab Results  Component Value Date   HGBA1C 11.1 (H) 11/17/2017   MPG 272 11/17/2017   No results found for: PROLACTIN No results found for: CHOL, TRIG, HDL, CHOLHDL, VLDL, LDLCALC  Physical Findings: AIMS: Facial and Oral Movements Muscles of Facial Expression: Moderate Lips and Perioral Area: Moderate Jaw: Minimal Tongue: Minimal,Extremity Movements Upper (arms, wrists, hands, fingers): None, normal Lower (legs, knees, ankles, toes): Minimal, Trunk Movements Neck, shoulders, hips: None, normal, Overall Severity Severity of abnormal movements (highest score from questions above): Moderate Incapacitation due to abnormal movements: Mild Patient's awareness of abnormal movements (rate only patient's report): Aware, mild distress, Dental Status Current problems with teeth and/or dentures?: Yes Does patient usually wear dentures?: No  CIWA:  CIWA-Ar Total: 10 COWS:  COWS Total Score: 20  Musculoskeletal: Strength & Muscle Tone: within normal limits Gait & Station: normal Patient leans: N/A  Psychiatric Specialty Exam: Physical Exam  Nursing note and vitals reviewed. Constitutional: He is oriented to person, place, and time. He appears well-developed and well-nourished.  HENT:  Head: Normocephalic and atraumatic.  Respiratory: Effort normal.  Neurological: He is alert and oriented to person, place, and time.    ROS  Blood pressure (!) 154/90, pulse 86, temperature 98.5 F (36.9 C), temperature source Oral, resp. rate 20, height  5\' 9"  (1.753 m), weight 81.6 kg, SpO2 97 %.Body mass index is 26.57 kg/m.  General Appearance: Disheveled  Eye Contact:  Fair  Speech:  Pressured  Volume:  Increased  Mood:  Dysphoric and Irritable  Affect:  Labile  Thought Process:  Coherent and Descriptions of Associations: Circumstantial  Orientation:  Full (Time, Place, and Person)  Thought Content:  Hallucinations: Auditory  Suicidal Thoughts:  Yes.  without intent/plan  Homicidal Thoughts:  No  Memory:  Immediate;   Fair Recent;   Fair Remote;   Fair  Judgement:  Impaired  Insight:  Lacking  Psychomotor Activity:  Increased and Restlessness  Concentration:  Concentration: Fair and Attention Span: Fair  Recall:  [employer] of Knowledge:  Fair  Language:  Fair  Akathisia:  Negative  Handed:  Right  AIMS (if indicated):     Assets:  Desire for Improvement Physical Health Resilience  ADL's:  Intact  Cognition:  WNL  Sleep:  Number of Hours: 5     Treatment Plan Summary: Daily contact with patient to assess and evaluate symptoms and progress in treatment, Medication management and Plan : Patient is seen and examined.  Patient is a 42 year old male with the above-stated past psychiatric history who is seen in follow-up.  #1 alcohol dependence/alcohol withdrawal-continue Librium 25 mg p.o. every 6 hours as needed CIWA greater than 10.  #2 depression/psychosis-he continues on Risperdal 2  mg p.o. twice daily.  He continues to complain of significant agitation and anxiety.  I will give him ziprasidone 10 mg IM x1 given his agitation.  I will increase his gabapentin to 400 mg p.o. twice daily, increase his hydroxyzine to 50 mg every 6 hours as needed anxiety, and add buspirone 10 mg p.o. 3 times daily.  We will continue the risk for doll 2 mg twice daily and his fluoxetine at his previous dosage.  #3 diabetes mellitus (poorly controlled-blood sugars approximately 163 today.  Continue current dosage of insulin.  #4 hypertension-his  pressure remains elevated.  He is on lisinopril 20 mg p.o. daily.  I am going to increase that to 20 mg p.o. twice daily.  #5-hyperlipidemia-no change and pravastatin dosage.  #6 chronic pain-continue gabapentin and ibuprofen.  #7 disposition-when stabilized will return to substance abuse treatment facility he has been at.  Antonieta Pert, MD 11/22/2017, 11:18 AM

## 2017-11-22 NOTE — Progress Notes (Signed)
Patient agitated at start of this writer's shift - see previous RN's note. Continuing to curse, threaten and slam doors. Prn meds to this point ineffective. Phenobarb IM given per order once it arrived from [facility]. Patient remained in his room for a short time however was back up and pacing in the milieu, asking for snack. Has continued to ask for medications and states, "nothing you're giving me is helping." Patient has continued to be medicated per orders and S. Melvenia Beam, PA aware of status and consulted. Patient unable to tolerate much of an assessment and contact is brief to minimize agitation and stimulation. CIWA at hs is a "16" and VS elevated. Will continue to monitor and redirect, support as needed. Will maintain safety of patient and peers.

## 2017-11-22 NOTE — Progress Notes (Addendum)
S- Called to 300 floor.Pt highly agitated despite Librium 25 mg Geodon 20 mg-C/O sever anxiety and dysphoria. O- Blood alcohol level was 370. CIWA 13       Behavior- agitated/punching walls/Speech increased volume/rate       Orientation Intact        Mood -angry/irritable        Affect-Congruent        Perception- obsessed with somatic discomfort        Thought-goal directed to relief of symptoms.No hallucinations.No SI No HI-Agrees to go to room and behave awaiting                       Medication not in house A- Severe ETOH W/D P- Increase Librium protocol to 50 mg and restart      Phenobarbital IM x1 now then prn PO/IM for W/D not controlled by Librium      Monitor vitals CIWA q dose x24 hours.      D/C Lisinopril and start Inderal x 7 days then resume Lisinopril

## 2017-11-22 NOTE — Progress Notes (Signed)
D Pt is observed OOB UAL on the 300 hall today. He presents to the nursing station and says " I need my nurse..ibuprofen need some medicine...just put me to sleep". His affect is flat, agitated and he is anxious. He demonstrates involuntary movement of his mouth with jaw-clenching and somelip smaacking observed.     A HE completed his daily assessment and on this he wrote he has had thoughts of SI today but he easily contracts with this nurse to stay safe. He became very agitated at 1015 and per MD order, writer administered to pt 10 mg IM geodon right glut. Pt tolerated without adverse side effect noted. Pt refused EKG.     R Safety in place.

## 2017-11-22 NOTE — Plan of Care (Signed)
  Problem: Education: Goal: Emotional status will improve Outcome: Not Progressing   

## 2017-11-22 NOTE — Progress Notes (Signed)
Pt approached Clinical research associate and requested Vistaril stating "the voices are getting bad."  Vistaril 50 mg PO PRN was administered and pt returned to his room.  Will continue to monitor and assess.

## 2017-11-22 NOTE — BHH Group Notes (Signed)
Pt attended the end portion of the AA meeting.

## 2017-11-23 DIAGNOSIS — F419 Anxiety disorder, unspecified: Secondary | ICD-10-CM

## 2017-11-23 LAB — GLUCOSE, CAPILLARY
GLUCOSE-CAPILLARY: 326 mg/dL — AB (ref 70–99)
GLUCOSE-CAPILLARY: 458 mg/dL — AB (ref 70–99)
Glucose-Capillary: 487 mg/dL — ABNORMAL HIGH (ref 70–99)
Glucose-Capillary: 259 mg/dL — ABNORMAL HIGH (ref 70–99)

## 2017-11-23 MED ORDER — OLANZAPINE 10 MG PO TBDP
10.0000 mg | ORAL_TABLET | Freq: Two times a day (BID) | ORAL | Status: DC
  Administered 2017-11-23 – 2017-11-24 (×2): 10 mg via ORAL
  Filled 2017-11-23 (×4): qty 1

## 2017-11-23 MED ORDER — OLANZAPINE 5 MG PO TBDP
5.0000 mg | ORAL_TABLET | Freq: Every day | ORAL | Status: DC
  Administered 2017-11-23: 5 mg via ORAL
  Filled 2017-11-23 (×4): qty 1

## 2017-11-23 MED ORDER — DIAZEPAM 5 MG PO TABS
5.0000 mg | ORAL_TABLET | Freq: Three times a day (TID) | ORAL | Status: DC
  Administered 2017-11-24 (×2): 5 mg via ORAL
  Filled 2017-11-23 (×2): qty 1

## 2017-11-23 MED ORDER — HYDROXYZINE HCL 50 MG PO TABS
50.0000 mg | ORAL_TABLET | Freq: Four times a day (QID) | ORAL | Status: DC | PRN
  Administered 2017-11-23 – 2017-11-24 (×2): 50 mg via ORAL
  Filled 2017-11-23: qty 1

## 2017-11-23 MED ORDER — GABAPENTIN 300 MG PO CAPS
300.0000 mg | ORAL_CAPSULE | Freq: Two times a day (BID) | ORAL | Status: DC
  Administered 2017-11-23 – 2017-11-24 (×2): 300 mg via ORAL
  Filled 2017-11-23 (×4): qty 1

## 2017-11-23 MED ORDER — OLANZAPINE 10 MG IM SOLR
INTRAMUSCULAR | Status: AC
  Administered 2017-11-23: 13:00:00
  Filled 2017-11-23: qty 10

## 2017-11-23 MED ORDER — IBUPROFEN 800 MG PO TABS
800.0000 mg | ORAL_TABLET | Freq: Four times a day (QID) | ORAL | Status: DC | PRN
  Administered 2017-11-23 – 2017-11-24 (×2): 800 mg via ORAL
  Filled 2017-11-23: qty 1

## 2017-11-23 MED ORDER — ALBUTEROL SULFATE (2.5 MG/3ML) 0.083% IN NEBU
2.5000 mg | INHALATION_SOLUTION | Freq: Four times a day (QID) | RESPIRATORY_TRACT | Status: DC | PRN
  Administered 2017-11-23: 2.5 mg via RESPIRATORY_TRACT
  Filled 2017-11-23: qty 3

## 2017-11-23 MED ORDER — LORAZEPAM 1 MG PO TABS
1.0000 mg | ORAL_TABLET | ORAL | Status: AC | PRN
  Administered 2017-11-23: 1 mg via ORAL
  Filled 2017-11-23: qty 1

## 2017-11-23 MED ORDER — NICOTINE POLACRILEX 2 MG MT GUM
2.0000 mg | CHEWING_GUM | OROMUCOSAL | Status: DC | PRN
  Administered 2017-11-23: 2 mg via ORAL
  Filled 2017-11-23: qty 1

## 2017-11-23 MED ORDER — ZIPRASIDONE MESYLATE 20 MG IM SOLR: 20.0000 mg | Freq: Two times a day (BID) | INTRAMUSCULAR | Status: DC | PRN

## 2017-11-23 MED ORDER — OLANZAPINE 10 MG IM SOLR: 10.0000 mg | Freq: Once | INTRAMUSCULAR | Status: DC | PRN

## 2017-11-23 MED ORDER — OLANZAPINE 10 MG PO TBDP
10.0000 mg | ORAL_TABLET | Freq: Three times a day (TID) | ORAL | Status: DC | PRN
  Administered 2017-11-23: 10 mg via ORAL
  Filled 2017-11-23: qty 1

## 2017-11-23 MED ORDER — ZIPRASIDONE MESYLATE 20 MG IM SOLR: 20.0000 mg | INTRAMUSCULAR | Status: DC | PRN

## 2017-11-23 MED ORDER — GUAIFENESIN-DM 100-10 MG/5ML PO SYRP
5.0000 mL | ORAL_SOLUTION | ORAL | Status: DC | PRN
  Administered 2017-11-23 – 2017-11-24 (×4): 5 mL via ORAL
  Filled 2017-11-23 (×4): qty 5

## 2017-11-23 MED ORDER — INSULIN ASPART 100 UNIT/ML ~~LOC~~ SOLN
10.0000 [IU] | Freq: Once | SUBCUTANEOUS | Status: AC
  Administered 2017-11-23: 10 [IU] via SUBCUTANEOUS

## 2017-11-23 MED ORDER — PROPRANOLOL HCL 20 MG PO TABS
20.0000 mg | ORAL_TABLET | Freq: Two times a day (BID) | ORAL | Status: DC
  Administered 2017-11-23 – 2017-11-24 (×2): 20 mg via ORAL
  Filled 2017-11-23 (×4): qty 1

## 2017-11-23 MED ORDER — DIAZEPAM 5 MG PO TABS
5.0000 mg | ORAL_TABLET | Freq: Two times a day (BID) | ORAL | Status: DC
  Administered 2017-11-23: 5 mg via ORAL
  Filled 2017-11-23: qty 1

## 2017-11-23 MED ORDER — SULFAMETHOXAZOLE-TRIMETHOPRIM 400-80 MG PO TABS
1.0000 | ORAL_TABLET | Freq: Two times a day (BID) | ORAL | Status: DC
  Administered 2017-11-23 – 2017-11-24 (×2): 1 via ORAL
  Filled 2017-11-23 (×4): qty 1
  Filled 2017-11-23: qty 7
  Filled 2017-11-23 (×2): qty 1
  Filled 2017-11-23: qty 7

## 2017-11-23 MED ORDER — OLANZAPINE 10 MG PO TBDP
10.0000 mg | ORAL_TABLET | Freq: Every day | ORAL | Status: DC
  Filled 2017-11-23 (×2): qty 1

## 2017-11-23 MED ORDER — LORAZEPAM 1 MG PO TABS: 1.0000 mg | ORAL_TABLET | ORAL | Status: DC | PRN

## 2017-11-23 NOTE — Progress Notes (Signed)
[facility] MD Progress Note  11/23/2017 11:07 AM Connor Bailey  MRN:  [id number]   Subjective: Patient reports today that he is feeling very agitated today.  He is actually requesting to have another Geodon injection.  Patient states that he has been on numerous medications in the past and only few have worked to help control his anger and his temper.  After being informed of his medication he reports that he is willing to continue on the current medications prescribed.  Currently patient states that he has had some suicidal thoughts, but will not be specific or give any details.  He denies any homicidal ideations or hallucinations.  Objective: Patient's chart and findings reviewed and discussed with treatment team.  Patient presents in the hallway.  He seems anxious and fidgety.  When he sits down he is bouncing his legs and moves around quite a bit while we are talking.  His voice makes him seem agitated as well.  Patient was started on Zyprexa 5 mg a day and 10 mg at night due to his agitation as well and patient has been put on numerous medications for agitation protocol.  Patient has not been interacting pleasantly with anyone and he has not attended any groups.   Principal Problem: MDD (major depressive disorder), recurrent, severe, with psychosis (HCC) Diagnosis:   Patient Active Problem List   Diagnosis Date Noted  . 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 Alcohol-induced mood disorder (HCC) [F10.94]   . Suicidal ideation [R45.851]   . Cough [R05] 11/18/2017  . Essential hypertension [I10] 11/18/2017  . Psychiatric diagnosis [F99] 11/18/2017  . Type 2 diabetes mellitus with hyperlipidemia (HCC) [E11.69, E78.5] 11/18/2017  . Renal insufficiency [N28.9]   . Diabetic ketoacidosis without coma associated with type 2 diabetes mellitus (HCC) [E11.10] 11/16/2017   Total Time spent with patient: 30 minutes  Past Psychiatric History: See  H&P  Past Medical History:  Past Medical History:  Diagnosis Date  . Diabetes mellitus without complication [facility])     Past Surgical History:  Procedure Laterality Date  . CLAVICLE SURGERY     Family History:  Family History  Problem Relation Age of Onset  . Fibromyalgia Mother    Family Psychiatric  History: See H&P Social History:  Social History   Substance and Sexual Activity  Alcohol Use Yes  . Frequency: Never     Social History   Substance and Sexual Activity  Drug Use Yes  . Frequency: 175.0 times per week  . Types: "Crack" cocaine, Morphine    Social History   Socioeconomic History  . Marital status: Single    Spouse name: unknown  . Number of children: 0  . Years of education: 0  . Highest education level: 9th grade  Occupational History  . Not on file  Social Needs  . Financial resource strain: Not hard at all  . Food insecurity:    Worry: Patient refused    Inability: Patient refused  . Transportation needs:    Medical: Patient refused    Non-medical: Patient refused  Tobacco Use  . Smoking status: Current Every Day Smoker  . Smokeless tobacco: Never Used  Substance and Sexual Activity  . Alcohol use: Yes    Frequency: Never  . Drug use: Yes    Frequency: 175.0 times per week    Types: "Crack" cocaine, Morphine  . Sexual activity: Not Currently    Birth control/protection: None  Lifestyle  . Physical activity:  Days per week: Patient refused    Minutes per session: Patient refused  . Stress: Not on file  Relationships  . Social connections:    Talks on phone: Not on file    Gets together: Not on file    Attends religious service: Not on file    Active member of club or organization: Not on file    Attends meetings of clubs or organizations: Not on file    Relationship status: Not on file  Other Topics Concern  . Not on file  Social History Narrative  . Not on file   Additional Social History:    Pain Medications: please see  mar Prescriptions: please see mar Over the Counter: please see mar History of alcohol / drug use?: Yes Longest period of sobriety (when/how long): unknown Negative Consequences of Use: Financial, Legal Withdrawal Symptoms: DTs, Delirium, Diarrhea, Weakness Name of Substance 1: alcohol 1 - Age of First Use: unknown 1 - Amount (size/oz): "as much as I can" 1 - Frequency: daily 1 - Duration: ongoing 1 - Last Use / Amount: 1024/19                  Sleep: Good  Appetite:  Fair  Current Medications: Current Facility-Administered Medications  Medication Dose Route Frequency Provider Last Rate Last Dose  . benzonatate (TESSALON) capsule 100 mg  100 mg Oral BID PRN Rankin, Shuvon B, NP   100 mg at 11/22/17 1944  . benztropine (COGENTIN) tablet 1 mg  1 mg Oral BID Antonieta Pert, MD   1 mg at 11/23/17 1033  . busPIRone (BUSPAR) tablet 10 mg  10 mg Oral TID Antonieta Pert, MD   10 mg at 11/23/17 1034  . chlordiazePOXIDE (LIBRIUM) capsule 50 mg  50 mg Oral Q6H PRN Antonieta Pert, MD   50 mg at 11/23/17 1034  . folic acid (FOLVITE) tablet 1 mg  1 mg Oral Daily Antonieta Pert, MD   1 mg at 11/23/17 1034  . gabapentin (NEURONTIN) capsule 400 mg  400 mg Oral BID Antonieta Pert, MD   400 mg at 11/23/17 1034  . hydrOXYzine (ATARAX/VISTARIL) tablet 50 mg  50 mg Oral Q6H PRN Hennessey Cantrell, Gerlene Burdock, FNP   50 mg at 11/23/17 0042  . ibuprofen (ADVIL,MOTRIN) tablet 600 mg  600 mg Oral Q6H PRN Antonieta Pert, MD   600 mg at 11/23/17 0409  . insulin aspart (novoLOG) injection 0-15 Units  0-15 Units Subcutaneous TID WC Rankin, Shuvon B, NP   11 Units at 11/23/17 0639  . insulin glargine (LANTUS) injection 39 Units  39 Units Subcutaneous QHS Rankin, Shuvon B, NP   39 Units at 11/22/17 2025  . [START ON 11/30/2017] lisinopril (PRINIVIL,ZESTRIL) tablet 40 mg  40 mg Oral Daily Court Joy, PA-C      . loperamide (IMODIUM) capsule 2-4 mg  2-4 mg Oral PRN Rankin, Shuvon B, NP   4 mg at  11/22/17 1631  . ziprasidone (GEODON) injection 20 mg  20 mg Intramuscular Q12H PRN Antonieta Pert, MD       And  . LORazepam (ATIVAN) tablet 1 mg  1 mg Oral PRN Antonieta Pert, MD      . multivitamin with minerals tablet 1 tablet  1 tablet Oral Daily Rankin, Shuvon B, NP   1 tablet at 11/23/17 1035  . nicotine (NICODERM CQ - dosed in mg/24 hours) patch 21 mg  21 mg Transdermal Daily Antonieta Pert,  MD   21 mg at 11/23/17 1035  . OLANZapine zydis (ZYPREXA) disintegrating tablet 10 mg  10 mg Oral QHS Antonieta Pert, MD      . OLANZapine zydis [facility]) disintegrating tablet 5 mg  5 mg Oral Daily Antonieta Pert, MD   5 mg at 11/23/17 1033  . ondansetron (ZOFRAN-ODT) disintegrating tablet 4 mg  4 mg Oral Q6H PRN Rankin, Shuvon B, NP      . pantoprazole (PROTONIX) EC tablet 40 mg  40 mg Oral Daily Antonieta Pert, MD   40 mg at 11/23/17 1034  . PHENObarbital (LUMINAL) injection 32.5 mg  32.5 mg Intramuscular Q6H PRN Antonieta Pert, MD   32.5 mg at 11/22/17 2001   Or  . PHENobarbital (LUMINAL) tablet 32.4 mg  32.4 mg Oral Q6H PRN Antonieta Pert, MD      . pravastatin (PRAVACHOL) tablet 40 mg  40 mg Oral QHS Rankin, Shuvon B, NP   40 mg at 11/22/17 1959  . propranolol (INDERAL) tablet 40 mg  40 mg Oral BID Court Joy, PA-C   40 mg at 11/23/17 1034  . thiamine (VITAMIN B-1) tablet 100 mg  100 mg Oral Daily Rankin, Shuvon B, NP   100 mg at 11/23/17 1035  . traZODone (DESYREL) tablet 200 mg  200 mg Oral QHS,MR X 1 Antonieta Pert, MD   200 mg at 11/22/17 2124  . ziprasidone (GEODON) injection 20 mg  20 mg Intramuscular Q12H PRN Antonieta Pert, MD   20 mg at 11/22/17 1830    Lab Results:  Results for orders placed or performed during the hospital encounter of 11/21/17 (from the past 48 hour(s))  Glucose, capillary     Status: Abnormal   Collection Time: 11/21/17  2:17 PM  Result Value Ref Range   Glucose-Capillary 255 (H) 70 - 99 mg/dL   Comment 1 Notify RN     Comment 2 Document in Chart   Glucose, capillary     Status: Abnormal   Collection Time: 11/21/17  5:13 PM  Result Value Ref Range   Glucose-Capillary 355 (H) 70 - 99 mg/dL   Comment 1 Notify RN    Comment 2 Document in Chart   Glucose, capillary     Status: Abnormal   Collection Time: 11/21/17  8:50 PM  Result Value Ref Range   Glucose-Capillary 222 (H) 70 - 99 mg/dL  Glucose, capillary     Status: Abnormal   Collection Time: 11/22/17  6:02 AM  Result Value Ref Range   Glucose-Capillary 162 (H) 70 - 99 mg/dL   Comment 1 Notify RN    Comment 2 Document in Chart   Glucose, capillary     Status: Abnormal   Collection Time: 11/22/17 12:07 PM  Result Value Ref Range   Glucose-Capillary 171 (H) 70 - 99 mg/dL   Comment 1 Notify RN    Comment 2 Document in Chart   Glucose, capillary     Status: Abnormal   Collection Time: 11/22/17  5:02 PM  Result Value Ref Range   Glucose-Capillary 466 (H) 70 - 99 mg/dL  Glucose, capillary     Status: Abnormal   Collection Time: 11/22/17  8:09 PM  Result Value Ref Range   Glucose-Capillary 257 (H) 70 - 99 mg/dL   Comment 1 Notify RN    Comment 2 Document in Chart   Glucose, capillary     Status: Abnormal   Collection Time: 11/23/17  6:34 AM  Result Value Ref Range   Glucose-Capillary 326 (H) 70 - 99 mg/dL   Comment 1 Notify RN    Comment 2 Document in Chart     Blood Alcohol level:  Lab Results  Component Value Date   ETH 37 (H) 11/20/2017    Metabolic Disorder Labs: Lab Results  Component Value Date   HGBA1C 11.1 (H) 11/17/2017   MPG 272 11/17/2017   No results found for: PROLACTIN No results found for: CHOL, TRIG, HDL, CHOLHDL, VLDL, LDLCALC  Physical Findings: AIMS: Facial and Oral Movements Muscles of Facial Expression: Moderate Lips and Perioral Area: Moderate Jaw: Moderate Tongue: Minimal,Extremity Movements Upper (arms, wrists, hands, fingers): None, normal Lower (legs, knees, ankles, toes): None, normal, Trunk  Movements Neck, shoulders, hips: None, normal, Overall Severity Severity of abnormal movements (highest score from questions above): None, normal Incapacitation due to abnormal movements: None, normal Patient's awareness of abnormal movements (rate only patient's report): Aware, mild distress, Dental Status Current problems with teeth and/or dentures?: No Does patient usually wear dentures?: No  CIWA:  CIWA-Ar Total: 12 COWS:  COWS Total Score: 20  Musculoskeletal: Strength & Muscle Tone: within normal limits Gait & Station: normal Patient leans: N/A  Psychiatric Specialty Exam: Physical Exam  Nursing note and vitals reviewed. Constitutional: He is oriented to person, place, and time. He appears well-developed and well-nourished.  Cardiovascular: Normal rate.  Respiratory: Effort normal.  Musculoskeletal: Normal range of motion.  Neurological: He is alert and oriented to person, place, and time.  Skin: Skin is warm.    Review of Systems  Constitutional: Negative.   HENT: Negative.   Eyes: Negative.   Respiratory: Negative.   Cardiovascular: Negative.   Gastrointestinal: Negative.   Genitourinary: Negative.   Musculoskeletal: Negative.   Skin: Negative.   Neurological: Negative.   Endo/Heme/Allergies: Negative.   Psychiatric/Behavioral: Positive for depression, substance abuse and suicidal ideas. Negative for hallucinations. The patient is nervous/anxious.     Blood pressure (!) 151/85, pulse 89, temperature 98.1 F (36.7 C), temperature source Oral, resp. rate 16, height 5\' 9"  (1.753 m), weight 81.6 kg, SpO2 97 %.Body mass index is 26.57 kg/m.  General Appearance: Disheveled  Eye Contact:  Fair  Speech:  Clear and Coherent and Normal Rate  Volume:  Normal  Mood:  Depressed  Affect:  Flat  Thought Process:  Linear and Descriptions of Associations: Intact  Orientation:  Full (Time, Place, and Person)  Thought Content:  WDL  Suicidal Thoughts:  Yes.  without  intent/plan  Homicidal Thoughts:  No  Memory:  Immediate;   Good Recent;   Good Remote;   Good  Judgement:  Poor  Insight:  Lacking  Psychomotor Activity:  Normal  Concentration:  Concentration: Good and Attention Span: Good  Recall:  Good  Fund of Knowledge:  Good  Language:  Good  Akathisia:  No  Handed:  Right  AIMS (if indicated):     Assets:  Communication Skills Desire for Improvement Physical Health Resilience  ADL's:  Intact  Cognition:  WNL  Sleep:  Number of Hours: 3.75   Problems addressed MDD severe recurrent Suicidal ideation  Treatment Plan Summary: The patient's vital signs being elevated and patient seeming anxious, agitated, and restless suspect the patient may be having some withdrawal symptoms.  Discussed with nurse and they will do a CIWA score to determine if patient meets criteria for a Librium dose. Daily contact with patient to assess and evaluate symptoms and progress in treatment, Medication management and Plan  is to: Continue Cogentin 1 mg p.o. twice daily for EPS Continue BuSpar 10 mg p.o. 3 times daily for anxiety Continue Librium detox protocol Continue Neurontin 400 mg p.o. twice daily for mood stability and withdrawal symptoms Continue Vistaril 50 mg p.o. every 6 hours as needed for anxiety Start Zyprexa Zydis 5 mg p.o. daily for mood stability Start Zyprexa Zydis 10 mg p.o. nightly for mood stability Continue propanolol 40 mg p.o. twice daily Continue trazodone 200 mg p.o. nightly as needed for insomnia and mood stability Continue phenobarbital p.o. or IM every 6 hours as needed if Librium is ineffective Start agitation protocol of Geodon 20 mg every 12 hours as needed and Ativan 1 mg Encourage group therapy participation  Maryfrances Bunnell, FNP 11/23/2017, 11:07 AM

## 2017-11-23 NOTE — [facility] Group Notes (Signed)
[facility] Group Notes:  (Nursing)  Date:  11/23/2017  Time: 400 PM Type of Therapy:  Nurse Education  Participation Level:  Active  Participation Quality:  Attentive  Affect:  Appropriate  Cognitive:  Appropriate  Insight:  Improving  Engagement in Group:  Improving  Modes of Intervention:  Discussion and Education  Summary of Progress/Problems: Nurse led group on Relaxation Shela Nevin 11/23/2017, 7:03 PM

## 2017-11-23 NOTE — Progress Notes (Signed)
Patient has slept for small periods of time but is frequently up at the NS with multiple requests. Remains medication focused and was given vistaril prn at 2450. Frequently asking for snacks, drinks. Patient given one pack of peanut trail mix and one pack of sugar free cookies along with iced water and diet ginger-ale. Patient remains irritable but is redirectable at this time.

## 2017-11-23 NOTE — [facility] Group Notes (Signed)
[facility] LCSW Group Therapy Note  11/23/2017  10:00-11:00AM  Type of Therapy and Topic:  Group Therapy:  Adding Supports Including Being Your Own Support  Participation Level:  Did Not Attend   Description of Group:  Patients in this group were introduced to the concept that additional supports including self-support are an essential part of recovery.  A song entitled "I Need Help!" was played and a group discussion was held in reaction to the idea of needing to add supports.  A song entitled "My Own Hero" was played and a group discussion ensued in which patients stated they could relate to the song and it inspired them to realize they have be willing to help themselves in order to succeed, because other people cannot achieve sobriety or stability for them.  We discussed adding a variety of healthy supports to address the various needs in their lives.  A song was played called "I Know Where I've Been" toward the end of group and used to conduct an inspirational wrap-up to group of remembering how far they have already come in their journey.  Therapeutic Goals: 1)  demonstrate the importance of being a part of one's own support system 2)  discuss reasons people in one's life may eventually be unable to be continually supportive  3)  identify the patient's current support system and   4)  elicit commitments to add healthy supports and to become more conscious of being self-supportive   Summary of Patient Progress:  N/A   Therapeutic Modalities:   Motivational Interviewing Activity  Dayani Winbush J Grossman-Orr       

## 2017-11-23 NOTE — Progress Notes (Signed)
D Pt has been agitated, pacing threatening staff ( staff reported pt saying " I'm going to tear this place up") and unable to calm down and settle today despit multiple conversations with MD, this writer care team and LCSW. He received 10 mg IM zyprxa at 1241 for acute agitation, was transferred to 500 hall and placed on unit restriction, and has since received 5 mg valium po, along with 1 mg ativan po and scheduled meds, all with no visible effect.     A HE refused to answer the questions on his daily inventory but said " no" when writer asked him if he wanted to hurt himslef today. He contradicts himslef frequently, often changing his answers, ie infront of MD, he said " just let me go...i don't want rehab" and then he said " I need help...ya'll are suppposed to help me". 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 Tardive becmes exacerbated and very exagerrated when he gets agitated, he demosntrates lip smacking tongue rolling and jaw clenching. His cbg's were 258 at 1200 ( he received 8 u novolg) and 487 ( received 10 units novolog with repeat cbg in 1 hr at 1830 and it was 458 ( he received additional 10 units novolog sq.) He cont to demand meds " just give me anything.Marland Kitchenibuprofen gotta have something...you gotta give me something" yet he does not have specific complaint " just give me some medicine". He Government social research officer notify Dr Jola Babinski ( at 1800) that he cannot tolerate unit restrictions and writer  Does this. He paces, yells, is not redirectable, demands to be relased, is impulsive, growls at staff and others, is agitated , started on albuterol for c/o sob and cough ( he says hge had pneumonia 2 weeks ago).      R Safeyt maintained.

## 2017-11-23 NOTE — [facility] Group Notes (Signed)
Pt was invited but did not attend orientation/goals group. 

## 2017-11-23 NOTE — Plan of Care (Signed)
  Problem: Education: Goal: Knowledge of [facility] Education information/materials will improve Outcome: Not Progressing   

## 2017-11-24 ENCOUNTER — Emergency Department (HOSPITAL_BASED_OUTPATIENT_CLINIC_OR_DEPARTMENT_OTHER)
Admission: EM | Admit: 2017-11-24 | Discharge: 2017-11-28 | Disposition: A | Discharge status: Home / Self Care | Payer: Federal, State, Local not specified - Other | Attending: Emergency Medicine | Admitting: Emergency Medicine

## 2017-11-24 ENCOUNTER — Encounter (HOSPITAL_BASED_OUTPATIENT_CLINIC_OR_DEPARTMENT_OTHER): Payer: Self-pay

## 2017-11-24 ENCOUNTER — Other Ambulatory Visit: Payer: Self-pay

## 2017-11-24 ENCOUNTER — Emergency Department (HOSPITAL_BASED_OUTPATIENT_CLINIC_OR_DEPARTMENT_OTHER): Payer: Self-pay

## 2017-11-24 DIAGNOSIS — F1020 Alcohol dependence, uncomplicated: Secondary | ICD-10-CM | POA: Insufficient documentation

## 2017-11-24 DIAGNOSIS — E1165 Type 2 diabetes mellitus with hyperglycemia: Secondary | ICD-10-CM

## 2017-11-24 DIAGNOSIS — F1010 Alcohol abuse, uncomplicated: Secondary | ICD-10-CM

## 2017-11-24 DIAGNOSIS — F1014 Alcohol abuse with alcohol-induced mood disorder: Secondary | ICD-10-CM | POA: Diagnosis present

## 2017-11-24 DIAGNOSIS — R45851 Suicidal ideations: Secondary | ICD-10-CM

## 2017-11-24 DIAGNOSIS — E119 Type 2 diabetes mellitus without complications: Secondary | ICD-10-CM | POA: Insufficient documentation

## 2017-11-24 DIAGNOSIS — F332 Major depressive disorder, recurrent severe without psychotic features: Secondary | ICD-10-CM | POA: Insufficient documentation

## 2017-11-24 DIAGNOSIS — F17200 Nicotine dependence, unspecified, uncomplicated: Secondary | ICD-10-CM | POA: Insufficient documentation

## 2017-11-24 DIAGNOSIS — R739 Hyperglycemia, unspecified: Secondary | ICD-10-CM

## 2017-11-24 DIAGNOSIS — Z79899 Other long term (current) drug therapy: Secondary | ICD-10-CM | POA: Insufficient documentation

## 2017-11-24 DIAGNOSIS — Z9114 Patient's other noncompliance with medication regimen: Secondary | ICD-10-CM

## 2017-11-24 DIAGNOSIS — Z794 Long term (current) use of insulin: Secondary | ICD-10-CM | POA: Insufficient documentation

## 2017-11-24 HISTORY — DX: Depression, unspecified: F32.A

## 2017-11-24 HISTORY — DX: Alcohol abuse, uncomplicated: F10.10

## 2017-11-24 HISTORY — DX: Other psychoactive substance abuse, uncomplicated: F19.10

## 2017-11-24 HISTORY — DX: Major depressive disorder, single episode, unspecified: F32.9

## 2017-11-24 HISTORY — DX: Type 1 diabetes mellitus without complications: E10.9

## 2017-11-24 LAB — CBC
HCT: 34.2 % — ABNORMAL LOW (ref 39.0–52.0)
HEMOGLOBIN: 10.6 g/dL — AB (ref 13.0–17.0)
MCH: 26.7 pg (ref 26.0–34.0)
MCHC: 31 g/dL (ref 30.0–36.0)
MCV: 86.1 fL (ref 80.0–100.0)
NRBC: 0 % (ref 0.0–0.2)
PLATELETS: 635 10*3/uL — AB (ref 150–400)
RBC: 3.97 MIL/uL — AB (ref 4.22–5.81)
RDW: 15.9 % — ABNORMAL HIGH (ref 11.5–15.5)
WBC: 9.3 10*3/uL (ref 4.0–10.5)

## 2017-11-24 LAB — CBG MONITORING, ED
GLUCOSE-CAPILLARY: 556 mg/dL — AB (ref 70–99)
Glucose-Capillary: 282 mg/dL — ABNORMAL HIGH (ref 70–99)

## 2017-11-24 LAB — URINALYSIS, ROUTINE W REFLEX MICROSCOPIC
Bilirubin Urine: NEGATIVE
HGB URINE DIPSTICK: NEGATIVE
Ketones, ur: NEGATIVE mg/dL
Leukocytes, UA: NEGATIVE
Nitrite: NEGATIVE
PH: 6 (ref 5.0–8.0)
Protein, ur: NEGATIVE mg/dL

## 2017-11-24 LAB — RAPID URINE DRUG SCREEN, HOSP PERFORMED
Amphetamines: NOT DETECTED
Barbiturates: NOT DETECTED
Benzodiazepines: POSITIVE — AB
Cocaine: NOT DETECTED
OPIATES: NOT DETECTED
Tetrahydrocannabinol: NOT DETECTED

## 2017-11-24 LAB — COMPREHENSIVE METABOLIC PANEL
ALK PHOS: 102 U/L (ref 38–126)
ALT: 11 U/L (ref 0–44)
AST: 13 U/L — AB (ref 15–41)
Albumin: 3.4 g/dL — ABNORMAL LOW (ref 3.5–5.0)
Anion gap: 11 (ref 5–15)
BUN: 17 mg/dL (ref 6–20)
CALCIUM: 8.8 mg/dL — AB (ref 8.9–10.3)
CO2: 22 mmol/L (ref 22–32)
CREATININE: 1.36 mg/dL — AB (ref 0.61–1.24)
Chloride: 96 mmol/L — ABNORMAL LOW (ref 98–111)
Glucose, Bld: 843 mg/dL (ref 70–99)
Potassium: 4.8 mmol/L (ref 3.5–5.1)
Sodium: 129 mmol/L — ABNORMAL LOW (ref 135–145)
Total Bilirubin: 0.3 mg/dL (ref 0.3–1.2)
Total Protein: 6.8 g/dL (ref 6.5–8.1)

## 2017-11-24 LAB — ACETAMINOPHEN LEVEL: ACETAMINOPHEN (TYLENOL), SERUM: 34 ug/mL — AB (ref 10–30)

## 2017-11-24 LAB — URINALYSIS, MICROSCOPIC (REFLEX): WBC UA: NONE SEEN WBC/hpf (ref 0–5)

## 2017-11-24 LAB — SALICYLATE LEVEL: Salicylate Lvl: <7 mg/dL (ref 2.8–30.0)

## 2017-11-24 LAB — ETHANOL: ALCOHOL ETHYL (B): 78 mg/dL — AB (ref <10)

## 2017-11-24 LAB — GLUCOSE, CAPILLARY
Glucose-Capillary: 280 mg/dL — ABNORMAL HIGH (ref 70–99)
Glucose-Capillary: 327 mg/dL — ABNORMAL HIGH (ref 70–99)

## 2017-11-24 MED ORDER — LORAZEPAM 1 MG PO TABS
0.0000 mg | ORAL_TABLET | Freq: Two times a day (BID) | ORAL | Status: DC
  Administered 2017-11-27: 1 mg via ORAL
  Administered 2017-11-27: 2 mg via ORAL
  Administered 2017-11-28: 1 mg via ORAL
  Filled 2017-11-24: qty 1
  Filled 2017-11-24: qty 2
  Filled 2017-11-24: qty 1

## 2017-11-24 MED ORDER — THIAMINE HCL 100 MG/ML IJ SOLN
100.0000 mg | Freq: Every day | INTRAMUSCULAR | Status: DC
  Filled 2017-11-24: qty 2

## 2017-11-24 MED ORDER — OLANZAPINE 10 MG PO TBDP
10.0000 mg | ORAL_TABLET | Freq: Every day | ORAL | Status: DC
  Filled 2017-11-24: qty 1

## 2017-11-24 MED ORDER — ONDANSETRON HCL 4 MG PO TABS: 4.0000 mg | ORAL_TABLET | Freq: Three times a day (TID) | ORAL | Status: DC | PRN

## 2017-11-24 MED ORDER — PRAVASTATIN SODIUM 20 MG PO TABS
40.0000 mg | ORAL_TABLET | Freq: Every day | ORAL | Status: DC
  Administered 2017-11-27: 40 mg via ORAL
  Filled 2017-11-24: qty 2
  Filled 2017-11-24: qty 1

## 2017-11-24 MED ORDER — ALUM & MAG HYDROXIDE-SIMETH 200-200-20 MG/5ML PO SUSP: 30.0000 mL | Freq: Four times a day (QID) | ORAL | Status: DC | PRN

## 2017-11-24 MED ORDER — OLANZAPINE 5 MG PO TBDP
5.0000 mg | ORAL_TABLET | Freq: Every day | ORAL | Status: DC
Start: 2017-11-24 — End: 2017-11-24
  Administered 2017-11-24: 5 mg via ORAL
  Filled 2017-11-24 (×3): qty 1

## 2017-11-24 MED ORDER — GABAPENTIN 300 MG PO CAPS
300.0000 mg | ORAL_CAPSULE | Freq: Three times a day (TID) | ORAL | Status: DC
  Administered 2017-11-24: 300 mg via ORAL
  Filled 2017-11-24: qty 21
  Filled 2017-11-24: qty 1
  Filled 2017-11-24 (×2): qty 21
  Filled 2017-11-24 (×2): qty 1

## 2017-11-24 MED ORDER — GABAPENTIN 300 MG PO CAPS
300.0000 mg | ORAL_CAPSULE | Freq: Three times a day (TID) | ORAL | Status: DC
  Administered 2017-11-25 – 2017-11-28 (×8): 300 mg via ORAL
  Filled 2017-11-24 (×8): qty 1

## 2017-11-24 MED ORDER — INSULIN ASPART 100 UNIT/ML ~~LOC~~ SOLN: 0.0000 [IU] | Freq: Three times a day (TID) | SUBCUTANEOUS | Status: DC

## 2017-11-24 MED ORDER — TRAZODONE HCL 100 MG PO TABS: 200.0000 mg | ORAL_TABLET | Freq: Every evening | ORAL | 0 refills | Status: AC | PRN

## 2017-11-24 MED ORDER — LORAZEPAM 2 MG/ML IJ SOLN: 0.0000 mg | Freq: Two times a day (BID) | INTRAMUSCULAR | Status: DC

## 2017-11-24 MED ORDER — PROPRANOLOL HCL 20 MG PO TABS: 20.0000 mg | ORAL_TABLET | Freq: Every day | ORAL | Status: DC

## 2017-11-24 MED ORDER — SULFAMETHOXAZOLE-TRIMETHOPRIM 400-80 MG PO TABS: 1.0000 | ORAL_TABLET | Freq: Two times a day (BID) | ORAL | 0 refills | Status: DC

## 2017-11-24 MED ORDER — INSULIN REGULAR HUMAN 100 UNIT/ML IJ SOLN
10.0000 [IU] | Freq: Once | INTRAMUSCULAR | Status: AC
  Administered 2017-11-24: 10 [IU] via INTRAVENOUS
  Filled 2017-11-24: qty 1

## 2017-11-24 MED ORDER — METOPROLOL SUCCINATE ER 25 MG PO TB24
25.0000 mg | ORAL_TABLET | Freq: Every day | ORAL | Status: DC
Start: 2017-11-24 — End: 2017-11-24
  Administered 2017-11-24: 25 mg via ORAL
  Filled 2017-11-24 (×3): qty 1
  Filled 2017-11-24: qty 7

## 2017-11-24 MED ORDER — VITAMIN B-1 100 MG PO TABS
100.0000 mg | ORAL_TABLET | Freq: Every day | ORAL | Status: DC
  Administered 2017-11-25 – 2017-11-28 (×4): 100 mg via ORAL
  Filled 2017-11-24 (×4): qty 1

## 2017-11-24 MED ORDER — SODIUM CHLORIDE 0.9 % IV BOLUS
1000.0000 mL | Freq: Once | INTRAVENOUS | Status: AC
  Administered 2017-11-24: 1000 mL via INTRAVENOUS

## 2017-11-24 MED ORDER — HYDROXYZINE HCL 25 MG PO TABS
25.0000 mg | ORAL_TABLET | Freq: Four times a day (QID) | ORAL | Status: DC | PRN
  Administered 2017-11-25 – 2017-11-28 (×5): 25 mg via ORAL
  Filled 2017-11-24 (×5): qty 1

## 2017-11-24 MED ORDER — NICOTINE 21 MG/24HR TD PT24
21.0000 mg | MEDICATED_PATCH | Freq: Every day | TRANSDERMAL | Status: DC
  Filled 2017-11-24: qty 1

## 2017-11-24 MED ORDER — HYDROXYZINE HCL 25 MG PO TABS
25.0000 mg | ORAL_TABLET | Freq: Four times a day (QID) | ORAL | Status: DC | PRN
  Filled 2017-11-24: qty 10

## 2017-11-24 MED ORDER — LORAZEPAM 2 MG/ML IJ SOLN: 0.0000 mg | Freq: Four times a day (QID) | INTRAMUSCULAR | Status: AC

## 2017-11-24 MED ORDER — LORAZEPAM 1 MG PO TABS
0.0000 mg | ORAL_TABLET | Freq: Four times a day (QID) | ORAL | Status: AC
  Administered 2017-11-25: 1 mg via ORAL
  Administered 2017-11-26: 2 mg via ORAL
  Administered 2017-11-26: 1 mg via ORAL
  Administered 2017-11-26: 2 mg via ORAL
  Filled 2017-11-24 (×2): qty 2
  Filled 2017-11-24 (×2): qty 1

## 2017-11-24 MED ORDER — INSULIN GLARGINE 100 UNIT/ML ~~LOC~~ SOLN
39.0000 [IU] | Freq: Every day | SUBCUTANEOUS | Status: DC
  Administered 2017-11-25 – 2017-11-27 (×4): 39 [IU] via SUBCUTANEOUS
  Filled 2017-11-24 (×2): qty 0.39

## 2017-11-24 MED ORDER — METOPROLOL SUCCINATE ER 25 MG PO TB24
25.0000 mg | ORAL_TABLET | Freq: Every day | ORAL | Status: DC
  Administered 2017-11-25 – 2017-11-28 (×4): 25 mg via ORAL
  Filled 2017-11-24 (×4): qty 1

## 2017-11-24 MED ORDER — NICOTINE POLACRILEX 2 MG MT GUM: 2.0000 mg | CHEWING_GUM | OROMUCOSAL | 0 refills | Status: DC | PRN

## 2017-11-24 MED ORDER — HYDROXYZINE HCL 25 MG PO TABS: 25.0000 mg | ORAL_TABLET | Freq: Four times a day (QID) | ORAL | 0 refills | Status: AC | PRN

## 2017-11-24 MED ORDER — METOPROLOL SUCCINATE ER 25 MG PO TB24: 25.0000 mg | ORAL_TABLET | Freq: Every day | ORAL | 0 refills | Status: AC

## 2017-11-24 MED ORDER — GABAPENTIN 300 MG PO CAPS: 300.0000 mg | ORAL_CAPSULE | Freq: Three times a day (TID) | ORAL | 0 refills | Status: AC

## 2017-11-24 MED ORDER — INSULIN REGULAR HUMAN 100 UNIT/ML IJ SOLN
15.0000 [IU] | Freq: Once | INTRAMUSCULAR | Status: AC
  Administered 2017-11-24: 15 [IU] via INTRAVENOUS
  Filled 2017-11-24: qty 1

## 2017-11-24 MED ORDER — DIAZEPAM 5 MG PO TABS: 5.0000 mg | ORAL_TABLET | Freq: Three times a day (TID) | ORAL | 0 refills | Status: DC

## 2017-11-24 NOTE — ED Provider Notes (Signed)
[facility] EMERGENCY DEPARTMENT Provider Note   CSN: [id number] Arrival date & time: 11/24/17  2007     History   Chief Complaint Chief Complaint  Patient presents with  . Hyperglycemia  . Suicidal    HPI Connor Bailey is a 42 y.o. male.  Pt presents to the ED today with SI.  He was d/c from Behavioral health this afternoon.  He is a diabetic known to be noncompliant with his meds.  He is also an alcoholic.  He was dropped off here for evaluation.  Pt is very drowsy, so history is difficult to obtain.  Pt said he would "take something" to kill himself, but he denies taking anything pta.   He was given his diabetic meds while in the hospital.     Past Medical History:  Diagnosis Date  . Alcohol abuse   . Depression   . Diabetes mellitus without complication (HCC)    non-compliant  . Substance abuse [facility])     Patient Active Problem List   Diagnosis Date Noted  . MDD (major depressive disorder), recurrent, severe, with psychosis (HCC) 11/21/2017  . Alcohol-induced mood disorder (HCC)   . Suicidal ideation   . Cough 11/18/2017  . Essential hypertension 11/18/2017  . Psychiatric diagnosis 11/18/2017  . Type 2 diabetes mellitus with hyperlipidemia (HCC) 11/18/2017  . Renal insufficiency   . Diabetic ketoacidosis without coma associated with type 2 diabetes mellitus (HCC) 11/16/2017    Past Surgical History:  Procedure Laterality Date  . CLAVICLE SURGERY          Home Medications    Prior to Admission medications   Medication Sig Start Date End Date Taking? Authorizing Provider  diazepam (VALIUM) 5 MG tablet Take 1 tablet (5 mg total) by mouth 3 (three) times daily. 11/24/17   Oneta Rack, NP  gabapentin (NEURONTIN) 300 MG capsule Take 1 capsule (300 mg total) by mouth 3 (three) times daily. 11/24/17   Oneta Rack, NP  hydrOXYzine (ATARAX/VISTARIL) 25 MG tablet Take 1 tablet (25 mg total) by mouth every 6 (six) hours as needed for itching or  anxiety. 11/24/17   Oneta Rack, NP  insulin aspart (NOVOLOG) 100 UNIT/ML injection Inject 0-10 Units into the skin 3 (three) times daily before meals.    [provider]  insulin glargine (LANTUS) 100 UNIT/ML injection Inject 39 Units into the skin at bedtime.    [provider]  metoprolol succinate (TOPROL-XL) 25 MG 24 hr tablet Take 1 tablet (25 mg total) by mouth daily. 11/25/17   Oneta Rack, NP  nicotine polacrilex (NICORETTE) 2 MG gum Take 1 each (2 mg total) by mouth as needed for smoking cessation. 11/24/17   Oneta Rack, NP  pravastatin (PRAVACHOL) 40 MG tablet Take 40 mg by mouth at bedtime.  11/14/17   [provider]  sulfamethoxazole-trimethoprim (BACTRIM,SEPTRA) 400-80 MG tablet Take 1 tablet by mouth every 12 (twelve) hours. 11/24/17   Oneta Rack, NP  traZODone (DESYREL) 100 MG tablet Take 2 tablets (200 mg total) by mouth at bedtime and may repeat dose one time if needed. 11/24/17   Oneta Rack, NP    Family History Family History  Problem Relation Age of Onset  . Fibromyalgia Mother     Social History Social History   Tobacco Use  . Smoking status: Current Every Day Smoker  . Smokeless tobacco: Never Used  Substance Use Topics  . Alcohol use: Yes    Frequency:  Never  . Drug use: Yes    Frequency: 175.0 times per week    Types: "Crack" cocaine, Morphine     Allergies   Patient has no known allergies.   Review of Systems Review of Systems  Psychiatric/Behavioral: Positive for suicidal ideas.  All other systems reviewed and are negative.    Physical Exam Updated Vital Signs BP (!) 152/89   Pulse 74   Temp 98.2 F (36.8 C) (Oral)   Resp (!) 24   Ht 5\' 9"  (1.753 m)   Wt 81.6 kg   SpO2 95%   BMI 26.57 kg/m   Physical Exam  Constitutional: He appears well-developed and well-nourished. He appears lethargic.  HENT:  Head: Normocephalic and atraumatic.  Right Ear: External ear normal.  Left Ear:  External ear normal.  Nose: Nose normal.  Mouth/Throat: Mucous membranes are dry.  Eyes: Pupils are equal, round, and reactive to light. Conjunctivae and EOM are normal.  Neck: Normal range of motion. Neck supple.  Cardiovascular: Normal rate, regular rhythm, normal heart sounds and intact distal pulses.  Pulmonary/Chest: Effort normal and breath sounds normal.  Abdominal: Soft. Bowel sounds are normal.  Musculoskeletal: Normal range of motion.  Neurological: He appears lethargic.  Pt will follow commands, but it takes him awhile.  He is moving all 4 extremities.  Skin: Skin is warm. Capillary refill takes less than 2 seconds.  Psychiatric: His speech is delayed.  Nursing note and vitals reviewed.    ED Treatments / Results  Labs (all labs ordered are listed, but only abnormal results are displayed) Labs Reviewed  CBC - Abnormal; Notable for the following components:      Result Value   RBC 3.97 (*)    Hemoglobin 10.6 (*)    HCT 34.2 (*)    RDW 15.9 (*)    Platelets 635 (*)    All other components within normal limits  URINALYSIS, ROUTINE W REFLEX MICROSCOPIC - Abnormal; Notable for the following components:   Specific Gravity, Urine <1.005 (*)    Glucose, UA >=500 (*)    All other components within normal limits  COMPREHENSIVE METABOLIC PANEL - Abnormal; Notable for the following components:   Sodium 129 (*)    Chloride 96 (*)    Glucose, Bld 843 (*)    Creatinine, Ser 1.36 (*)    Calcium 8.8 (*)    Albumin 3.4 (*)    AST 13 (*)    All other components within normal limits  ETHANOL - Abnormal; Notable for the following components:   Alcohol, Ethyl (B) 78 (*)    All other components within normal limits  ACETAMINOPHEN LEVEL - Abnormal; Notable for the following components:   Acetaminophen (Tylenol), Serum 34 (*)    All other components within normal limits  RAPID URINE DRUG SCREEN, HOSP PERFORMED - Abnormal; Notable for the following components:   Benzodiazepines  POSITIVE (*)    All other components within normal limits  URINALYSIS, MICROSCOPIC (REFLEX) - Abnormal; Notable for the following components:   Bacteria, UA RARE (*)    All other components within normal limits  CBG MONITORING, ED - Abnormal; Notable for the following components:   Glucose-Capillary >600 (*)    All other components within normal limits  CBG MONITORING, ED - Abnormal; Notable for the following components:   Glucose-Capillary 556 (*)    All other components within normal limits  CBG MONITORING, ED - Abnormal; Notable for the following components:   Glucose-Capillary 282 (*)  All other components within normal limits  SALICYLATE LEVEL  CBG MONITORING, ED    EKG None  Radiology Dg Chest Port 1 View  Result Date: 11/24/2017 CLINICAL DATA:  Hyperglycemia. EXAM: PORTABLE CHEST 1 VIEW COMPARISON:  Radiographs of November 20, 2017. FINDINGS: Stable cardiomediastinal silhouette. Central pulmonary vascular congestion and probable bilateral perihilar edema is noted. No pneumothorax or pleural effusion is noted. Bony thorax is unremarkable. Minimal left midlung subsegmental atelectasis is noted. IMPRESSION: Central pulmonary vascular congestion and probable mild bilateral perihilar edema is noted. Minimal left midlung subsegmental atelectasis. Electronically Signed   By: Lupita Raider, M.D.   On: 11/24/2017 21:08    Procedures Procedures (including critical care time)  Medications Ordered in ED Medications  LORazepam (ATIVAN) injection 0-4 mg (has no administration in time range)    Or  LORazepam (ATIVAN) tablet 0-4 mg (has no administration in time range)  LORazepam (ATIVAN) injection 0-4 mg (has no administration in time range)    Or  LORazepam (ATIVAN) tablet 0-4 mg (has no administration in time range)  thiamine (VITAMIN B-1) tablet 100 mg (has no administration in time range)    Or  thiamine (B-1) injection 100 mg (has no administration in time range)    ondansetron (ZOFRAN) tablet 4 mg (has no administration in time range)  alum & mag hydroxide-simeth (MAALOX/MYLANTA) 200-200-20 MG/5ML suspension 30 mL (has no administration in time range)  nicotine (NICODERM CQ - dosed in mg/24 hours) patch 21 mg (has no administration in time range)  gabapentin (NEURONTIN) capsule 300 mg (has no administration in time range)  hydrOXYzine (ATARAX/VISTARIL) tablet 25 mg (has no administration in time range)  insulin aspart (novoLOG) injection 0-10 Units (has no administration in time range)  insulin glargine (LANTUS) injection 39 Units (has no administration in time range)  metoprolol succinate (TOPROL-XL) 24 hr tablet 25 mg (has no administration in time range)  pravastatin (PRAVACHOL) tablet 40 mg (has no administration in time range)  sodium chloride 0.9 % bolus 1,000 mL (0 mLs Intravenous Stopped 11/24/17 2153)  insulin regular (NOVOLIN R,HUMULIN R) 100 units/mL injection 10 Units (10 Units Intravenous Given 11/24/17 2050)  insulin regular (NOVOLIN R,HUMULIN R) 100 units/mL injection 15 Units (15 Units Intravenous Given 11/24/17 2227)  sodium chloride 0.9 % bolus 1,000 mL (0 mLs Intravenous Stopped 11/24/17 2329)     Initial Impression / Assessment and Plan / ED Course  I have reviewed the triage vital signs and the nursing notes.  Pertinent labs & imaging results that were available during my care of the patient were reviewed by me and considered in my medical decision making (see chart for details).    Pt's initial blood sugar is 843.  Nl AG.  No DKA.  Blood sugar has come down to 282 now after 2L NS and 25 u of insulin.  He is more alert as his blood sugar has come down.  He is now medically clear.  CIWA protocol ordered secondary to alcohol abuse.  TTS consult ordered.  Home meds ordered.  Disposition pending TTS recommendation.  Final Clinical Impressions(s) / ED Diagnoses   Final diagnoses:  Hyperglycemia  Suicidal ideation  Poorly  controlled type 2 diabetes mellitus (HCC)  Alcohol abuse  Noncompliance with medications    ED Discharge Orders    None       Jacalyn Lefevre, MD 11/24/17 2340

## 2017-11-24 NOTE — ED Notes (Signed)
Ice water provided.

## 2017-11-24 NOTE — ED Notes (Signed)
CRITICAL VALUE ALERT  Critical Value: Glucose 843  Date & Time Notied: 11/24/2017 2113  Provider Notified: Particia Nearing

## 2017-11-24 NOTE — BHH Suicide Risk Assessment (Signed)
[facility] Discharge Suicide Risk Assessment   Principal Problem: MDD (major depressive disorder), recurrent, severe, with psychosis (HCC) Discharge Diagnoses:  Patient Active Problem List   Diagnosis Date Noted  . 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 Alcohol-induced mood disorder (HCC) [F10.94]   . Suicidal ideation [R45.851]   . Cough [R05] 11/18/2017  . Essential hypertension [I10] 11/18/2017  . Psychiatric diagnosis [F99] 11/18/2017  . Type 2 diabetes mellitus with hyperlipidemia (HCC) [E11.69, E78.5] 11/18/2017  . Renal insufficiency [N28.9]   . Diabetic ketoacidosis without coma associated with type 2 diabetes mellitus (HCC) [E11.10] 11/16/2017    Total Time spent with patient: 30 minutes  Musculoskeletal: Strength & Muscle Tone: within normal limits Gait & Station: normal Patient leans: N/A  Psychiatric Specialty Exam: Review of Systems  All other systems reviewed and are negative.   Blood pressure (!) 158/93, pulse 85, temperature 97.9 F (36.6 C), temperature source Oral, resp. rate 18, height 5\' 9"  (1.753 m), weight 81.6 kg, SpO2 99 %.Body mass index is 26.57 kg/m.  General Appearance: Disheveled  Eye Solicitor::  Fair  Speech:  Normal Rate409  Volume:  Normal  Mood:  Anxious and Irritable  Affect:  Congruent  Thought Process:  Coherent and Descriptions of Associations: Intact  Orientation:  Full (Time, Place, and Person)  Thought Content:  Logical  Suicidal Thoughts:  No  Homicidal Thoughts:  No  Memory:  Immediate;   Fair Recent;   Fair Remote;   Fair  Judgement:  Impaired  Insight:  Lacking  Psychomotor Activity:  Normal  Concentration:  Fair  Recall:  [employer] of Knowledge:Fair  Language: Fair  Akathisia:  Negative  Handed:  Right  AIMS (if indicated):     Assets:  Desire for Improvement Resilience  Sleep:  Number of Hours: 6.25  Cognition: WNL  ADL's:  Intact   Mental Status Per Nursing  Assessment::   On Admission:  Suicidal ideation indicated by patient  Demographic Factors:  Male, Divorced or widowed, Caucasian, Low socioeconomic status, Living alone and Unemployed  Loss Factors: NA  Historical Factors: Impulsivity  Risk Reduction Factors:   NA  Continued Clinical Symptoms:  Bipolar Disorder:   Bipolar II Alcohol/Substance Abuse/Dependencies  Cognitive Features That Contribute To Risk:  Thought constriction (tunnel vision)    Suicide Risk:  Minimal: No identifiable suicidal ideation.  Patients presenting with no risk factors but with morbid ruminations; may be classified as minimal risk based on the severity of the depressive symptoms  Follow-up Information    [facility] Follow up.   Specialty:  [facility] information: [address] [place] [address] [phone]           Plan Of Care/Follow-up recommendations:  Activity:  ad lib  Antonieta Pert, MD 11/24/2017, 11:27 AM

## 2017-11-24 NOTE — Progress Notes (Signed)
D: Pt denies SI/HI/AVH. Pt has been anxious and irritable on the unit , but redirectable. Pt was informed to give the doctor a chance to help him , if he really wanted help , but do not waste our time . Pt was informed we were here to keep him safe and to help him move to a better place mentally. Pt presents as if he has ineffective coping strategies in place and has not learned to deal with adversity in a positive manner . Pt appears to try to intimidate certain staff members into getting things he thinks he needs. Pt was encouraged to talk with the doctor to help him move forward with his Tx. Pt was also encouraged to relax and allow the medications he was taking to actually work .   A: Pt was offered support and encouragement. Pt was given scheduled medications. Pt was encourage to attend groups. Q 15 minute checks were done for safety.   R:  Pt is taking medication. Pt receptive to treatment and safety maintained on unit.   Problem: Activity: Goal: Sleeping patterns will improve Outcome: Progressing   Problem: Coping: Goal: Ability to demonstrate self-control will improve Outcome: Progressing   Problem: Safety: Goal: Periods of time without injury will increase Outcome: Progressing

## 2017-11-24 NOTE — Progress Notes (Signed)
  [facility] Adult Case Management Discharge Plan :  Will you be returning to the same living situation after discharge:  Yes,  [facility] At discharge, do you have transportation home?: Yes,  [facility] Do you have the ability to pay for your medications: No. Pt will work with [employer].  Release of information consent forms completed and in the chart;  Patient's signature needed at discharge.  Patient to Follow up at: Follow-up Information    [facility]. Go on 11/26/2017.   Specialty:  Behavioral Health Why:  Please attend your appt on Wednesday, 11/26/17, at 8:00am.   Contact information: [address] [place] [address] [phone]        [facility]. Go to.   Why:  [facility] will pick you up from the hospital and return you to the transitional living program immediately. Contact information: [address] [place], [place] [address] P: [phone]          Next level of care provider has access to [facility] Link:no  Safety Planning and Suicide Prevention discussed: Yes,  mother  Have you used any form of tobacco in the last 30 days? (Cigarettes, Smokeless Tobacco, Cigars, and/or Pipes): Yes  Has patient been referred to the Quitline?: Patient refused referral  Patient has been referred for addiction treatment: Yes  [name], LCSW 11/24/2017, 3:09 PM

## 2017-11-24 NOTE — Progress Notes (Signed)
CSW spoke to Domenic Schwab at [employer].  He said pt is part of the transitional living program and can return at discharge.  They will come pick up pt as soon as they have a Zenaida Niece available.   Garner Nash, MSW, LCSW Clinical Social Worker 11/24/2017 2:16 PM

## 2017-11-24 NOTE — Progress Notes (Signed)
Pt refused labs and EKG, pt stated he got 3 EKG's already, pt educated on possible issues with certain medications and encouraged to get it done due to him already receiving medications that can cause issues with his heart.

## 2017-11-24 NOTE — ED Notes (Signed)
Radiology at bedside

## 2017-11-24 NOTE — ED Triage Notes (Signed)
Pt was just discharged from behavioral today, states he is still suicidal. Pt is slurring his words and his sugar is over 600, states he did not take any of his diabetes medications today, despite them being distributed to him when he was dc'd from [facility]. Pt is unsteady on feet, falling asleep in triage, denies any ETOH use this evening.

## 2017-11-24 NOTE — Progress Notes (Signed)
Recreation Therapy Notes  Date: 10.28.19 Time: 1000 Location: 500 Hall Dayroom  Group Topic:  Goal Setting  Goal Area(s) Addresses:  Patient will be able to identify at least 3 life goals.   Patient will be able to identify benefit of investing in life goals.  Patient will be able to identify benefit of setting life goals.   Intervention: Worksheet, pencils  Activity: Life Goals.  Patients were to identify goals they wanted to accomplish in a wee, month, year and five years.  Patients were to then identify the obstacles they would face, what they needed in order to be successful and what they could start doing to work towards their goals.  Education:  Discharge Planning, Coping Skills, Life Goals   Education Outcome: Acknowledges Education/In Group Clarification Provided/Needs Additional Education  Clinical Observations: Pt did not attend group.    Caroll Rancher, LRT/CTRS         Lillia Abed, Emeka Lindner A 11/24/2017 11:21 AM

## 2017-11-24 NOTE — ED Notes (Signed)
Staffing called for suicidal case, per staffing no sitter available at this time or 11pm, sitter to be sent as soon is available. System wide tech to be sit with suicidal pt.

## 2017-11-24 NOTE — Progress Notes (Signed)
[facility] MD Progress Note  11/24/2017 10:02 AM Connor Bailey  MRN:  [id number] Subjective:  Patient is seen and examined. Patient is a 42 year old male with a past psychiatric history significant for unspecified psychotic illness, alcohol use disorder/dependence, history of opioid use/dependence, reported history of depression with psychotic features, poorly controlled type 2 diabetes mellitus who presented to the [facility] emergency department on 11/16/2017 originally with upper respiratory tract symptoms. He had moved from the [place] and had to come here to be in a rehabilitation facility. During the work-up he was found to be in diabetic ketoacidosis, and was hospitalized. He was discharged on 11/18/2017. He was to secure a ride back to his sober living house. On 10/24 he really presented to the [facility] emergency department with suicidal ideation. He was evaluated then and stated that he was suicidal. He stated he would do "the easiest thing". The patient reported drinking daily and he would drink 40 ounce beers. He also had been hospitalized on 11/07/2017 at the [facility] facility in [place]. He was hospitalized there from 1011 03/09/2016. At that time he reported auditory hallucinations, anxiety, depression and suicidal ideation. He was reportedly trying to stick things in electrical outlets in an attempt to kill himself. He was discharged there on 11/14/2017. He apparently was only out of hospital treatment for 2 days before presenting to the [facility] admission as noted above. [facility] also had [facility] to 10/21/2017. He presented at that time with a history of bipolar disorder, polysubstance use disorder and alcohol withdrawal. He stated he was having suicidal thoughts with no plan but did state he attempted to overdose on methamphetamines a few weeks prior to admission. It also sounded as  though he had extraparametal side effects to the risk for doll and had some drooling. He had some aggressive behavior at that time as well. From a psychiatric perspective he was discharged on Risperdal, hydroxyzine, folic acid. He was admitted to our facility for evaluation and stabilization.  Objective: Patient is seen and examined.  Patient is a 42 year old male with a past psychiatric history significant for unspecified psychotic illness, antisocial personality disorder, alcohol use disorder/dependence, history of opioid use/dependence, poorly controlled diabetes mellitus type 2, and hypertension.  He seen in follow-up.  It was quite an eventful day yesterday.  He began to act out and was very drug seeking.  He ended up having to receive intramuscular medications and to be transferred to the [facility] hall.  He acted out a great deal.  I changed his medications around, and did place him on Valium 5 mg p.o. 3 times daily in the short run.  He sedated this morning.  Nursing notes reflect that he slept approximately 6.5 hours, which per his previous reports is an improvement.  He is sedated, but awake and alert when awakened.  His blood pressure is elevated.  He stated he still having some suicidal thoughts.  No evidence of active auditory hallucinations at this time.  The patient had requested discharge yesterday, but "I am thinking about it".  Principal Problem: MDD (major depressive disorder), recurrent, severe, with psychosis (HCC) Diagnosis:   Patient Active Problem List   Diagnosis Date Noted  . 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 Alcohol-induced mood disorder (HCC) [F10.94]   . Suicidal ideation [R45.851]   . Cough [R05] 11/18/2017  . Essential hypertension [I10] 11/18/2017  . Psychiatric diagnosis [  F99] 11/18/2017  . Type 2 diabetes mellitus with hyperlipidemia (HCC) [E11.69, E78.5] 11/18/2017  . Renal insufficiency [N28.9]   . Diabetic  ketoacidosis without coma associated with type 2 diabetes mellitus (HCC) [E11.10] 11/16/2017   Total Time spent with patient: 15 minutes  Past Psychiatric History: See admission H&P  Past Medical History:  Past Medical History:  Diagnosis Date  . Diabetes mellitus without complication [facility])     Past Surgical History:  Procedure Laterality Date  . CLAVICLE SURGERY     Family History:  Family History  Problem Relation Age of Onset  . Fibromyalgia Mother    Family Psychiatric  History: See admission H&P Social History:  Social History   Substance and Sexual Activity  Alcohol Use Yes  . Frequency: Never     Social History   Substance and Sexual Activity  Drug Use Yes  . Frequency: 175.0 times per week  . Types: "Crack" cocaine, Morphine    Social History   Socioeconomic History  . Marital status: Single    Spouse name: unknown  . Number of children: 0  . Years of education: 0  . Highest education level: 9th grade  Occupational History  . Not on file  Social Needs  . Financial resource strain: Not hard at all  . Food insecurity:    Worry: Patient refused    Inability: Patient refused  . Transportation needs:    Medical: Patient refused    Non-medical: Patient refused  Tobacco Use  . Smoking status: Current Every Day Smoker  . Smokeless tobacco: Never Used  Substance and Sexual Activity  . Alcohol use: Yes    Frequency: Never  . Drug use: Yes    Frequency: 175.0 times per week    Types: "Crack" cocaine, Morphine  . Sexual activity: Not Currently    Birth control/protection: None  Lifestyle  . Physical activity:    Days per week: Patient refused    Minutes per session: Patient refused  . Stress: Not on file  Relationships  . Social connections:    Talks on phone: Not on file    Gets together: Not on file    Attends religious service: Not on file    Active member of club or organization: Not on file    Attends meetings of clubs or organizations: Not  on file    Relationship status: Not on file  Other Topics Concern  . Not on file  Social History Narrative  . Not on file   Additional Social History:    Pain Medications: please see mar Prescriptions: please see mar Over the Counter: please see mar History of alcohol / drug use?: Yes Longest period of sobriety (when/how long): unknown Negative Consequences of Use: Financial, Legal Withdrawal Symptoms: DTs, Delirium, Diarrhea, Weakness Name of Substance 1: alcohol 1 - Age of First Use: unknown 1 - Amount (size/oz): "as much as I can" 1 - Frequency: daily 1 - Duration: ongoing 1 - Last Use / Amount: 1024/19                  Sleep: Fair  Appetite:  Good  Current Medications: Current Facility-Administered Medications  Medication Dose Route Frequency Provider Last Rate Last Dose  . albuterol (PROVENTIL) (2.5 MG/3ML) 0.083% nebulizer solution 2.5 mg  2.5 mg Nebulization Q6H PRN Antonieta Pert, MD   2.5 mg at 11/23/17 1542  . diazepam (VALIUM) tablet 5 mg  5 mg Oral TID Antonieta Pert, MD   5 mg  at 11/24/17 0833  . folic acid (FOLVITE) tablet 1 mg  1 mg Oral Daily Antonieta Pert, MD   1 mg at 11/24/17 1610  . gabapentin (NEURONTIN) capsule 300 mg  300 mg Oral TID Antonieta Pert, MD      . guaiFENesin-dextromethorphan [facility] DM) 100-10 MG/5ML syrup 5 mL  5 mL Oral Q4H PRN Antonieta Pert, MD   5 mL at 11/24/17 0441  . hydrOXYzine (ATARAX/VISTARIL) tablet 25 mg  25 mg Oral Q6H PRN Antonieta Pert, MD      . ibuprofen (ADVIL,MOTRIN) tablet 800 mg  800 mg Oral Q6H PRN Antonieta Pert, MD   800 mg at 11/23/17 1719  . insulin aspart (novoLOG) injection 0-15 Units  0-15 Units Subcutaneous TID WC Rankin, Shuvon B, NP   8 Units at 11/24/17 0636  . insulin glargine (LANTUS) injection 39 Units  39 Units Subcutaneous QHS Rankin, Shuvon B, NP   39 Units at 11/23/17 2023  . [START ON 11/30/2017] lisinopril (PRINIVIL,ZESTRIL) tablet 40 mg  40 mg Oral Daily  Court Joy, PA-C      . loperamide (IMODIUM) capsule 2-4 mg  2-4 mg Oral PRN Rankin, Shuvon B, NP   4 mg at 11/22/17 1631  . metoprolol succinate (TOPROL-XL) 24 hr tablet 25 mg  25 mg Oral Daily Antonieta Pert, MD      . multivitamin with minerals tablet 1 tablet  1 tablet Oral Daily Rankin, Shuvon B, NP   1 tablet at 11/24/17 0833  . nicotine polacrilex (NICORETTE) gum 2 mg  2 mg Oral PRN Antonieta Pert, MD   2 mg at 11/23/17 1507  . OLANZapine (ZYPREXA) injection 10 mg  10 mg Intramuscular Once PRN Antonieta Pert, MD      . OLANZapine zydis (ZYPREXA) disintegrating tablet 10 mg  10 mg Oral Q8H PRN Antonieta Pert, MD   10 mg at 11/23/17 2024   And  . ziprasidone (GEODON) injection 20 mg  20 mg Intramuscular PRN Antonieta Pert, MD      . Melene Muller ON 11/25/2017] OLANZapine zydis (ZYPREXA) disintegrating tablet 10 mg  10 mg Oral QHS Antonieta Pert, MD      . OLANZapine zydis (ZYPREXA) disintegrating tablet 5 mg  5 mg Oral Daily Antonieta Pert, MD      . pantoprazole (PROTONIX) EC tablet 40 mg  40 mg Oral Daily Antonieta Pert, MD   40 mg at 11/24/17 9604  . pravastatin (PRAVACHOL) tablet 40 mg  40 mg Oral QHS Rankin, Shuvon B, NP   40 mg at 11/23/17 2024  . sulfamethoxazole-trimethoprim (BACTRIM,SEPTRA) 400-80 MG per tablet 1 tablet  1 tablet Oral Q12H Antonieta Pert, MD   1 tablet at 11/24/17 [phone]  . thiamine (VITAMIN B-1) tablet 100 mg  100 mg Oral Daily Rankin, Shuvon B, NP   100 mg at 11/24/17 0833  . traZODone (DESYREL) tablet 200 mg  200 mg Oral QHS,MR X 1 Jola Babinski Marlane Mingle, MD   200 mg at 11/23/17 2228    Lab Results:  Results for orders placed or performed during the hospital encounter of 11/21/17 (from the past 48 hour(s))  Glucose, capillary     Status: Abnormal   Collection Time: 11/22/17 12:07 PM  Result Value Ref Range   Glucose-Capillary 171 (H) 70 - 99 mg/dL   Comment 1 Notify RN    Comment 2 Document in Chart   Glucose, capillary  Status: Abnormal   Collection Time: 11/22/17  5:02 PM  Result Value Ref Range   Glucose-Capillary 466 (H) 70 - 99 mg/dL  Glucose, capillary     Status: Abnormal   Collection Time: 11/22/17  8:09 PM  Result Value Ref Range   Glucose-Capillary 257 (H) 70 - 99 mg/dL   Comment 1 Notify RN    Comment 2 Document in Chart   Glucose, capillary     Status: Abnormal   Collection Time: 11/23/17  6:34 AM  Result Value Ref Range   Glucose-Capillary 326 (H) 70 - 99 mg/dL   Comment 1 Notify RN    Comment 2 Document in Chart   Glucose, capillary     Status: Abnormal   Collection Time: 11/23/17  5:00 PM  Result Value Ref Range   Glucose-Capillary 487 (H) 70 - 99 mg/dL  Glucose, capillary     Status: Abnormal   Collection Time: 11/23/17  6:20 PM  Result Value Ref Range   Glucose-Capillary 458 (H) 70 - 99 mg/dL  Glucose, capillary     Status: Abnormal   Collection Time: 11/23/17  7:52 PM  Result Value Ref Range   Glucose-Capillary 259 (H) 70 - 99 mg/dL  Glucose, capillary     Status: Abnormal   Collection Time: 11/24/17  6:14 AM  Result Value Ref Range   Glucose-Capillary 280 (H) 70 - 99 mg/dL    Blood Alcohol level:  Lab Results  Component Value Date   ETH 37 (H) 11/20/2017    Metabolic Disorder Labs: Lab Results  Component Value Date   HGBA1C 11.1 (H) 11/17/2017   MPG 272 11/17/2017   No results found for: PROLACTIN No results found for: CHOL, TRIG, HDL, CHOLHDL, VLDL, LDLCALC  Physical Findings: AIMS: Facial and Oral Movements Muscles of Facial Expression: Moderate Lips and Perioral Area: Mild Jaw: Moderate Tongue: Mild,Extremity Movements Upper (arms, wrists, hands, fingers): None, normal Lower (legs, knees, ankles, toes): None, normal, Trunk Movements Neck, shoulders, hips: None, normal, Overall Severity Severity of abnormal movements (highest score from questions above): Mild Incapacitation due to abnormal movements: None, normal Patient's awareness of abnormal  movements (rate only patient's report): Aware, mild distress, Dental Status Current problems with teeth and/or dentures?: No Does patient usually wear dentures?: No  CIWA:  CIWA-Ar Total: 3 COWS:  COWS Total Score: 20  Musculoskeletal: Strength & Muscle Tone: within normal limits Gait & Station: normal Patient leans: N/A  Psychiatric Specialty Exam: Physical Exam  Nursing note and vitals reviewed. Constitutional: He is oriented to person, place, and time. He appears well-developed and well-nourished.  HENT:  Head: Normocephalic and atraumatic.  Respiratory: Effort normal.  Neurological: He is alert and oriented to person, place, and time.    ROS  Blood pressure (!) 173/106, pulse 80, temperature 97.9 F (36.6 C), temperature source Oral, resp. rate 18, height 5\' 9"  (1.753 m), weight 81.6 kg, SpO2 99 %.Body mass index is 26.57 kg/m.  General Appearance: Disheveled  Eye Contact:  Fair  Speech:  Slow  Volume:  Decreased  Mood:  Sedated  Affect:  Congruent  Thought Process:  Coherent and Descriptions of Associations: Circumstantial  Orientation:  Full (Time, Place, and Person)  Thought Content:  Logical  Suicidal Thoughts:  Yes.  without intent/plan  Homicidal Thoughts:  No  Memory:  Immediate;   Fair Recent;   Fair Remote;   Fair  Judgement:  Impaired  Insight:  Lacking  Psychomotor Activity:  Decreased  Concentration:  Concentration: Fair and  Attention Span: Fair  Recall:  Poor  Fund of Knowledge:  Poor  Language:  Fair  Akathisia:  Negative  Handed:  Right  AIMS (if indicated):     Assets:  Desire for Improvement Physical Health  ADL's:  Intact  Cognition:  WNL  Sleep:  Number of Hours: 6.25     Treatment Plan Summary: Daily contact with patient to assess and evaluate symptoms and progress in treatment, Medication management and Plan : Patient is seen and examined.  Patient is a 42 year old male with the above-stated past psychiatric history who is seen in  follow-up.  #1 alcohol dependence/alcohol withdrawal-I do not think that much of what we are seeing has anything to do with alcohol withdrawal.  He is really only been out of the hospital 4 days out of the last 3 weeks.  I did go with Valium yesterday 5 mg 3 times daily.  This is more to control his agitation.  I have stopped the lorazepam as well as the Librium.  We will continue the Valium for now, but I would not trust him with the medication after discharge.  He stated to social work on Saturday while he was having the conversation with social work and his mother that he was not going to stop drinking.  #2 depression/psychosis-because of the lack of efficacy of the Risperdal he was switched to Zyprexa.  He seems to slept a bit better with the Zyprexa.  Because of the sedation in the morning I am going to decrease his daytime dose to 5 mg, and we will continue the 10 mg at at bedtime.  We will continue the agitation protocol.  Also for mood stability and decrease aggression I am going to increase his gabapentin to 300 mg p.o. 3 times daily.  Because he is calm her and on the Valium now I will decrease his hydroxyzine to 25 mg every 6 hours as needed anxiety.  The BuSpar has already been stopped.  He continues on his fluoxetine at his previous dosage.  #3 diabetes mellitus type 2 poorly controlled-his blood sugar this morning is 240.  Last night it was in the 400s.  He has been placed on unit restriction to keeping him from eating so much carbohydrates.  We will continue his current treatment until we can see what his stable blood sugar is for him.  #4 hypertension his blood pressure is remains elevated.  I had increased his life lisinopril to 40 mg p.o. daily.  That does not appear to have assisted.  He was on propranolol I think for his movement disorder issues.  I am going to stop the propranolol, and place him on metoprolol XL 25 mg p.o. daily in addition to the lisinopril.  His heart rate on the propranolol  was 80, so I am assuming that he will tolerate that from a heart rate perspective.  #5 hyperlipidemia-no change in current pravastatin dosage.  #6 chronic pain-gabapentin increased to 300 mg p.o. 3 times daily, and the patient requested increase in the ibuprofen and that was moved up to 800 mg every 8 hours as needed pain.  #7 antisocial personality disorder-basically as per above.  #8 disposition-when the patient is a bit more stable, and his agitation and threatening behaviors are decreased we will consider discharge at that time.  Antonieta Pert, MD 11/24/2017, 10:02 AM

## 2017-11-24 NOTE — ED Notes (Signed)
Patient is very drowsy to be interviewed and answer questions.

## 2017-11-24 NOTE — Discharge Summary (Addendum)
Physician Discharge Summary Note  Patient:  Connor Bailey is an 42 y.o., male MRN:  [id number] DOB:  1975/03/25 Patient phone:  [phone] (home)  Patient address:   [address] [place] [address],  Total Time spent with patient: 20 minutes  Date of Admission:  11/21/2017 Date of Discharge: 11/24/2016  Reason for Admission:  Per assessment note:Patient is a 42 year old male with a past psychiatric history significant for unspecified psychotic illness, alcohol use disorder/dependence, history of opioid use/dependence, reported history of depression with psychotic features, poorly controlled type 2 diabetes mellitus who presented to the [facility] emergency department on 11/16/2017 originally with upper respiratory tract symptoms. He had moved from the [place] and had to come here to be in a rehabilitation facility. During the work-up he was found to be in diabetic ketoacidosis, and was hospitalized. He was discharged on 11/18/2017. He was to secure a ride back to his sober living house. On 10/24 he really presented to the [facility] emergency department with suicidal ideation. He was evaluated then and stated that he was suicidal. He stated he would do "the easiest thing". The patient reported drinking daily and he would drink 40 ounce beers. He also had been hospitalized on 11/07/2017 at the [facility] facility in [place]. He was hospitalized there from 1011 03/09/2016. At that time he reported auditory hallucinations, anxiety, depression and suicidal ideation. He was reportedly trying to stick things in electrical outlets in an attempt to kill himself. He was discharged there on 11/14/2017. He apparently was only out of hospital treatment for 2 days before presenting to the [facility] admission as noted above. [facility] also had [facility] to 10/21/2017. He presented at that time with a  history of bipolar disorder, polysubstance use disorder and alcohol withdrawal. He stated he was having suicidal thoughts with no plan but did state he attempted to overdose on methamphetamines a few weeks prior to admission. It also sounded as though he had extraparametal side effects to the risk for doll and had some drooling. He had some aggressive behavior at that time as well. From a psychiatric perspective he was discharged on Risperdal, hydroxyzine, folic acid.  Principal Problem: MDD (major depressive disorder), recurrent, severe, with psychosis [facility]) Discharge Diagnoses: Patient Active Problem List   Diagnosis Date Noted  . 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 Alcohol-induced mood disorder (HCC) [F10.94]   . Suicidal ideation [R45.851]   . Cough [R05] 11/18/2017  . Essential hypertension [I10] 11/18/2017  . Psychiatric diagnosis [F99] 11/18/2017  . Type 2 diabetes mellitus with hyperlipidemia (HCC) [E11.69, E78.5] 11/18/2017  . Renal insufficiency [N28.9]   . Diabetic ketoacidosis without coma associated with type 2 diabetes mellitus (HCC) [E11.10] 11/16/2017    Past Psychiatric History:per admission assessment notes  Although the patient stated he had only been psychiatrically hospitalized on 2 occasions it appears as though he has had more than that.  According to the paperwork she has been previously diagnosed with bipolar disorder, depression, generalized anxiety disorder, chronic pain, opiate dependence, alcohol dependence.  He most recently has been on the risperidone.  He also appears to potentially have tardive dyskinesia.   Past Medical History:  Past Medical History:  Diagnosis Date  . Diabetes mellitus without complication [facility])     Past Surgical History:  Procedure Laterality Date  . CLAVICLE SURGERY     Family History:  Family History  Problem Relation Age of Onset  . Fibromyalgia Mother    Family  Psychiatric  History:  Social History:  Social History   Substance and Sexual Activity  Alcohol Use Yes  . Frequency: Never     Social History   Substance and Sexual Activity  Drug Use Yes  . Frequency: 175.0 times per week  . Types: "Crack" cocaine, Morphine    Social History   Socioeconomic History  . Marital status: Single    Spouse name: unknown  . Number of children: 0  . Years of education: 0  . Highest education level: 9th grade  Occupational History  . Not on file  Social Needs  . Financial resource strain: Not hard at all  . Food insecurity:    Worry: Patient refused    Inability: Patient refused  . Transportation needs:    Medical: Patient refused    Non-medical: Patient refused  Tobacco Use  . Smoking status: Current Every Day Smoker  . Smokeless tobacco: Never Used  Substance and Sexual Activity  . Alcohol use: Yes    Frequency: Never  . Drug use: Yes    Frequency: 175.0 times per week    Types: "Crack" cocaine, Morphine  . Sexual activity: Not Currently    Birth control/protection: None  Lifestyle  . Physical activity:    Days per week: Patient refused    Minutes per session: Patient refused  . Stress: Not on file  Relationships  . Social connections:    Talks on phone: Not on file    Gets together: Not on file    Attends religious service: Not on file    Active member of club or organization: Not on file    Attends meetings of clubs or organizations: Not on file    Relationship status: Not on file  Other Topics Concern  . Not on file  Social History Narrative  . Not on file    Hospital Course:  Connor Bailey was admitted for MDD (major depressive disorder), recurrent, severe, with psychosis (HCC) and crisis management.  Pt was treated discharged with the medications listed below under Medication List.  Medical problems were identified and treated as needed.  Home medications were restarted as appropriate.  Improvement was monitored by  observation and Babette Relic 's daily report of symptom reduction.  Emotional and mental status was monitored by daily self-inventory reports completed by Babette Relic and clinical staff.         Babette Relic was evaluated by the treatment team for stability and plans for continued recovery upon discharge. Babette Relic 's motivation was an integral factor for scheduling further treatment. Employment, transportation, bed availability, health status, family support, and any pending legal issues were also considered during hospital stay. Pt was offered further treatment options upon discharge including but not limited to Residential, Intensive Outpatient, and Outpatient treatment.  Ryon Layton will follow up with the services as listed below under Follow Up Information.     Upon completion of this admission the patient was both mentally and medically stable for discharge denying suicidal or homicidal ideation.  Babette Relic responded well to treatment with Neurontin 300 mg and trazodone 200 mg and Zyprexa 15 mg  without adverse effects.Pt demonstrated improvement without reported or observed adverse effects to the point of stability appropriate for outpatient management. Pertinent labs include:Hgb A1C- elevated POC (327), for which outpatient follow-up is necessary for lab recheck as mentioned below. Reviewed CBC, CMP, BAL, and UDS; all unremarkable aside  from noted exceptions.   Physical Findings: AIMS: Facial and Oral Movements Muscles of Facial Expression: None, normal Lips and Perioral Area: None, normal Jaw: None, normal Tongue: None, normal,Extremity Movements Upper (arms, wrists, hands, fingers): None, normal Lower (legs, knees, ankles, toes): None, normal, Trunk Movements Neck, shoulders, hips: None, normal, Overall Severity Severity of abnormal movements (highest score from questions above): None, normal Incapacitation due to abnormal movements: None, normal Patient's awareness of  abnormal movements (rate only patient's report): No Awareness, Dental Status Current problems with teeth and/or dentures?: No Does patient usually wear dentures?: No  CIWA:  CIWA-Ar Total: 3 COWS:  COWS Total Score: 20  Musculoskeletal: Strength & Muscle Tone: within normal limits Gait & Station: normal Patient leans: N/A  Psychiatric Specialty Exam: See SRA by MD Physical Exam  Review of Systems  Psychiatric/Behavioral: Negative for depression (stable) and hallucinations. The patient is not nervous/anxious.        Reported that mood has improved overall   All other systems reviewed and are negative.   Blood pressure (!) 158/93, pulse 85, temperature 97.9 F (36.6 C), temperature source Oral, resp. rate 18, height 5\' 9"  (1.753 m), weight 81.6 kg, SpO2 99 %.Body mass index is 26.57 kg/m.   Have you used any form of tobacco in the last 30 days? (Cigarettes, Smokeless Tobacco, Cigars, and/or Pipes): Yes  Has this patient used any form of tobacco in the last 30 days? (Cigarettes, Smokeless Tobacco, Cigars, and/or Pipes), No  Blood Alcohol level:  Lab Results  Component Value Date   ETH 37 (H) 11/20/2017    Metabolic Disorder Labs:  Lab Results  Component Value Date   HGBA1C 11.1 (H) 11/17/2017   MPG 272 11/17/2017   No results found for: PROLACTIN No results found for: CHOL, TRIG, HDL, CHOLHDL, VLDL, LDLCALC  See Psychiatric Specialty Exam and Suicide Risk Assessment completed by Attending Physician prior to discharge.  Discharge destination:  Home  Is patient on multiple antipsychotic therapies at discharge:  No   Has Patient had three or more failed trials of antipsychotic monotherapy by history:  No  Recommended Plan for Multiple Antipsychotic Therapies: NA  Discharge Instructions    Diet - low sodium heart healthy   Complete by:  As directed    Discharge instructions   Complete by:  As directed    Take all medications as prescribed. Keep all follow-up  appointments as scheduled.  Do not consume alcohol or use illegal drugs while on prescription medications. Report any adverse effects from your medications to your primary care provider promptly.  In the event of recurrent symptoms or worsening symptoms, call 911, a crisis hotline, or go to the nearest emergency department for evaluation.   Increase activity slowly   Complete by:  As directed      Allergies as of 11/24/2017   No Known Allergies     Medication List    STOP taking these medications   benzonatate 100 MG capsule Commonly known as:  TESSALON   benztropine 1 MG tablet Commonly known as:  COGENTIN   folic acid 1 MG tablet Commonly known as:  FOLVITE   guaiFENesin-dextromethorphan 100-10 MG/5ML syrup Commonly known as:  ROBITUSSIN DM   hydrOXYzine 25 MG capsule Commonly known as:  VISTARIL   lisinopril 20 MG tablet Commonly known as:  PRINIVIL,ZESTRIL   risperiDONE 3 MG tablet Commonly known as:  RISPERDAL   traMADol 50 MG tablet Commonly known as:  ULTRAM     TAKE these medications  Indication  diazepam 5 MG tablet Commonly known as:  VALIUM Take 1 tablet (5 mg total) by mouth 3 (three) times daily.  Indication:  Feeling Anxious   gabapentin 300 MG capsule Commonly known as:  NEURONTIN Take 1 capsule (300 mg total) by mouth 3 (three) times daily. What changed:  when to take this  Indication:  mood stablization   hydrOXYzine 25 MG tablet Commonly known as:  ATARAX/VISTARIL Take 1 tablet (25 mg total) by mouth every 6 (six) hours as needed for itching or anxiety.  Indication:  Feeling Anxious, Pain   insulin aspart 100 UNIT/ML injection Commonly known as:  novoLOG Inject 0-10 Units into the skin 3 (three) times daily before meals.  Indication:  Type 2 Diabetes   insulin glargine 100 UNIT/ML injection Commonly known as:  LANTUS Inject 39 Units into the skin at bedtime.  Indication:  Type 2 Diabetes   metoprolol succinate 25 MG 24 hr  tablet Commonly known as:  TOPROL-XL Take 1 tablet (25 mg total) by mouth daily. Start taking on:  11/25/2017  Indication:  High Blood Pressure Disorder   nicotine polacrilex 2 MG gum Commonly known as:  NICORETTE Take 1 each (2 mg total) by mouth as needed for smoking cessation.  Indication:  Nicotine Addiction   pravastatin 40 MG tablet Commonly known as:  PRAVACHOL Take 40 mg by mouth at bedtime.  Indication:  Disease involving Lipid Deposits in the Arteries   sulfamethoxazole-trimethoprim 400-80 MG tablet Commonly known as:  BACTRIM,SEPTRA Take 1 tablet by mouth every 12 (twelve) hours.  Indication:  Upper Respiratory Tract Infection   traZODone 100 MG tablet Commonly known as:  DESYREL Take 2 tablets (200 mg total) by mouth at bedtime and may repeat dose one time if needed. What changed:  when to take this  Indication:  Trouble Sleeping      Follow-up Information    [facility] up.   Specialty:  [facility] information: [address] [place] [address] [phone]           Follow-up recommendations:  Activity:  as tolerated Diet:  heart healthy  Comments:  Take all medications as prescribed. Keep all follow-up appointments as scheduled.  Do not consume alcohol or use illegal drugs while on prescription medications. Report any adverse effects from your medications to your primary care provider promptly.  In the event of recurrent symptoms or worsening symptoms, call 911, a crisis hotline, or go to the nearest emergency department for evaluation.   Signed: Oneta Rack, NP 11/24/2017, 11:56 AM

## 2017-11-24 NOTE — [facility] Group Notes (Signed)
[facility] LCSW Group Therapy Note  Date/Time:11/24/17, 1315  Type of Therapy and Topic:  Group Therapy:  Overcoming Obstacles  Participation Level:  Did not attend  Description of Group:    In this group patients will be encouraged to explore what they see as obstacles to their own wellness and recovery. They will be guided to discuss their thoughts, feelings, and behaviors related to these obstacles. The group will process together ways to cope with barriers, with attention given to specific choices patients can make. Each patient will be challenged to identify changes they are motivated to make in order to overcome their obstacles. This group will be process-oriented, with patients participating in exploration of their own experiences as well as giving and receiving support and challenge from other group members.  Therapeutic Goals: 1. Patient will identify personal and current obstacles as they relate to admission. 2. Patient will identify barriers that currently interfere with their wellness or overcoming obstacles.  3. Patient will identify feelings, thought process and behaviors related to these barriers. 4. Patient will identify two changes they are willing to make to overcome these obstacles:    Summary of Patient Progress      Therapeutic Modalities:   Cognitive Behavioral Therapy Solution Focused Therapy Motivational Interviewing Relapse Prevention Therapy  Greg Anthoney Sheppard, LCSW 

## 2017-11-24 NOTE — Progress Notes (Signed)
Patient discharged to lobby. Patient was stable and appreciative at that time. All papers, samples and prescriptions were given and valuables returned. Verbal understanding expressed. Denies SI/HI and A/VH. Patient given opportunity to express concerns and ask questions.  

## 2017-11-25 ENCOUNTER — Encounter (HOSPITAL_BASED_OUTPATIENT_CLINIC_OR_DEPARTMENT_OTHER): Payer: Self-pay | Admitting: Registered Nurse

## 2017-11-25 DIAGNOSIS — F332 Major depressive disorder, recurrent severe without psychotic features: Secondary | ICD-10-CM

## 2017-11-25 DIAGNOSIS — Z9114 Patient's other noncompliance with medication regimen: Secondary | ICD-10-CM | POA: Diagnosis not present

## 2017-11-25 DIAGNOSIS — F1010 Alcohol abuse, uncomplicated: Secondary | ICD-10-CM | POA: Insufficient documentation

## 2017-11-25 LAB — CBG MONITORING, ED
GLUCOSE-CAPILLARY: 87 mg/dL (ref 70–99)
GLUCOSE-CAPILLARY: 104 mg/dL — AB (ref 70–99)
GLUCOSE-CAPILLARY: 212 mg/dL — AB (ref 70–99)
GLUCOSE-CAPILLARY: 117 mg/dL — AB (ref 70–99)
Glucose-Capillary: 297 mg/dL — ABNORMAL HIGH (ref 70–99)

## 2017-11-25 MED ORDER — LOPERAMIDE HCL 2 MG PO CAPS
4.0000 mg | ORAL_CAPSULE | ORAL | Status: DC | PRN
  Administered 2017-11-25: 4 mg via ORAL
  Filled 2017-11-25: qty 2

## 2017-11-25 MED ORDER — AMOXICILLIN 250 MG/5ML PO SUSR: 500.0000 mg | Freq: Two times a day (BID) | ORAL | 0 refills | Status: DC

## 2017-11-25 MED ORDER — INSULIN ASPART 100 UNIT/ML ~~LOC~~ SOLN
0.0000 [IU] | Freq: Three times a day (TID) | SUBCUTANEOUS | Status: DC
  Administered 2017-11-25: 3 [IU] via SUBCUTANEOUS
  Administered 2017-11-25: 5 [IU] via SUBCUTANEOUS
  Administered 2017-11-26: 2 [IU] via SUBCUTANEOUS
  Filled 2017-11-25 (×2): qty 1

## 2017-11-25 MED ORDER — INSULIN GLARGINE 100 UNIT/ML ~~LOC~~ SOLN
SUBCUTANEOUS | Status: AC
  Filled 2017-11-25: qty 1

## 2017-11-25 MED ORDER — INSULIN ASPART 100 UNIT/ML ~~LOC~~ SOLN
1.0000 [IU] | Freq: Three times a day (TID) | SUBCUTANEOUS | Status: DC
  Administered 2017-11-25 – 2017-11-26 (×2): 4 [IU] via SUBCUTANEOUS
  Administered 2017-11-26: 10 [IU] via SUBCUTANEOUS
  Administered 2017-11-27: 5 [IU] via SUBCUTANEOUS
  Filled 2017-11-25 (×3): qty 1

## 2017-11-25 MED ORDER — INSULIN ASPART 100 UNIT/ML ~~LOC~~ SOLN: 4.0000 [IU] | Freq: Three times a day (TID) | SUBCUTANEOUS | Status: DC

## 2017-11-25 MED FILL — Insulin Regular (Human) Inj 100 Unit/ML: INTRAMUSCULAR | Qty: 0.1 | Status: AC

## 2017-11-25 MED FILL — Insulin Regular (Human) Inj 100 Unit/ML: INTRAMUSCULAR | Qty: 0.15 | Status: AC

## 2017-11-25 MED FILL — Insulin Regular (Human) Inj 100 Unit/ML: INTRAMUSCULAR | Qty: 0.01 | Status: CN

## 2017-11-25 NOTE — ED Notes (Signed)
Patient speaking with tele psych

## 2017-11-25 NOTE — Tx Team (Signed)
Interdisciplinary Treatment and Diagnostic Plan Update  11/25/2017 Time of Session: 1442 Connor Bailey MRN: [id number]  Principal Diagnosis: MDD (major depressive disorder), recurrent, severe, with psychosis (HCC)  Secondary Diagnoses: Principal Problem:   MDD (major depressive disorder), recurrent, severe, with psychosis (HCC) Active Problems:   Suicidal ideation   Current Medications:  No current facility-administered medications for this encounter.    Current Outpatient Medications  Medication Sig Dispense Refill  . amoxicillin (AMOXIL) 250 MG/5ML suspension Take 10 mLs (500 mg total) by mouth 2 (two) times daily. 200 mL 0  . diazepam (VALIUM) 5 MG tablet Take 1 tablet (5 mg total) by mouth 3 (three) times daily. 10 tablet 0  . gabapentin (NEURONTIN) 300 MG capsule Take 1 capsule (300 mg total) by mouth 3 (three) times daily. 90 capsule 0  . hydrOXYzine (ATARAX/VISTARIL) 25 MG tablet Take 1 tablet (25 mg total) by mouth every 6 (six) hours as needed for itching or anxiety. 30 tablet 0  . insulin aspart (NOVOLOG) 100 UNIT/ML injection Inject 0-10 Units into the skin 3 (three) times daily before meals.    . insulin glargine (LANTUS) 100 UNIT/ML injection Inject 39 Units into the skin at bedtime.    . metoprolol succinate (TOPROL-XL) 25 MG 24 hr tablet Take 1 tablet (25 mg total) by mouth daily. 30 tablet 0  . nicotine polacrilex (NICORETTE) 2 MG gum Take 1 each (2 mg total) by mouth as needed for smoking cessation. 100 tablet 0  . pravastatin (PRAVACHOL) 40 MG tablet Take 40 mg by mouth at bedtime.   0  . sulfamethoxazole-trimethoprim (BACTRIM,SEPTRA) 400-80 MG tablet Take 1 tablet by mouth every 12 (twelve) hours. 6 tablet 0  . traZODone (DESYREL) 100 MG tablet Take 2 tablets (200 mg total) by mouth at bedtime and may repeat dose one time if needed. 60 tablet 0   Facility-Administered Medications Ordered in Other Encounters  Medication Dose Route Frequency Provider Last Rate Last  Dose  . alum & mag hydroxide-simeth (MAALOX/MYLANTA) 200-200-20 MG/5ML suspension 30 mL  30 mL Oral Q6H PRN Jacalyn Lefevre, MD      . gabapentin (NEURONTIN) capsule 300 mg  300 mg Oral TID Jacalyn Lefevre, MD      . hydrOXYzine (ATARAX/VISTARIL) tablet 25 mg  25 mg Oral Q6H PRN Jacalyn Lefevre, MD      . insulin aspart (novoLOG) injection 0-9 Units  0-9 Units Subcutaneous TID WC Melene Plan, DO      . insulin aspart (novoLOG) injection 4 Units  4 Units Subcutaneous TID WC Melene Plan, DO      . insulin glargine (LANTUS) injection 39 Units  39 Units Subcutaneous QHS Jacalyn Lefevre, MD   39 Units at 11/25/17 0004  . LORazepam (ATIVAN) injection 0-4 mg  0-4 mg Intravenous Q6H Jacalyn Lefevre, MD       Or  . LORazepam (ATIVAN) tablet 0-4 mg  0-4 mg Oral Q6H Jacalyn Lefevre, MD      . Melene Muller ON 11/27/2017] LORazepam (ATIVAN) injection 0-4 mg  0-4 mg Intravenous Q12H Jacalyn Lefevre, MD       Or  . Melene Muller ON 11/27/2017] LORazepam (ATIVAN) tablet 0-4 mg  0-4 mg Oral Q12H Jacalyn Lefevre, MD      . metoprolol succinate (TOPROL-XL) 24 hr tablet 25 mg  25 mg Oral Daily Jacalyn Lefevre, MD      . nicotine (NICODERM CQ - dosed in mg/24 hours) patch 21 mg  21 mg Transdermal Daily Jacalyn Lefevre, MD      .  ondansetron (ZOFRAN) tablet 4 mg  4 mg Oral Q8H PRN Jacalyn Lefevre, MD      . pravastatin (PRAVACHOL) tablet 40 mg  40 mg Oral QHS Jacalyn Lefevre, MD      . thiamine (VITAMIN B-1) tablet 100 mg  100 mg Oral Daily Jacalyn Lefevre, MD       Or  . thiamine (B-1) injection 100 mg  100 mg Intravenous Daily Jacalyn Lefevre, MD       PTA Medications: No medications prior to admission.    Patient Stressors: Financial difficulties Health problems Legal issue Medication change or noncompliance Occupational concerns Substance abuse  Patient Strengths: Ability for insight Average or above average intelligence Physical Health  Treatment Modalities: Medication Management, Group therapy, Case management,   1 to 1 session with clinician, Psychoeducation, Recreational therapy.   Physician Treatment Plan for Primary Diagnosis: MDD (major depressive disorder), recurrent, severe, with psychosis (HCC) Long Term Goal(s): Improvement in symptoms so as ready for discharge Improvement in symptoms so as ready for discharge   Short Term Goals: Ability to identify changes in lifestyle to reduce recurrence of condition will improve Ability to verbalize feelings will improve Ability to disclose and discuss suicidal ideas Ability to demonstrate self-control will improve Ability to identify and develop effective coping behaviors will improve Ability to maintain clinical measurements within normal limits will improve Compliance with prescribed medications will improve Ability to identify triggers associated with substance abuse/mental health issues will improve Ability to identify changes in lifestyle to reduce recurrence of condition will improve Ability to verbalize feelings will improve Ability to disclose and discuss suicidal ideas Ability to demonstrate self-control will improve Ability to identify and develop effective coping behaviors will improve Ability to maintain clinical measurements within normal limits will improve Compliance with prescribed medications will improve Ability to identify triggers associated with substance abuse/mental health issues will improve  Medication Management: Evaluate patient's response, side effects, and tolerance of medication regimen.  Therapeutic Interventions: 1 to 1 sessions, Unit Group sessions and Medication administration.  Evaluation of Outcomes: Adequate for Discharge  Physician Treatment Plan for Secondary Diagnosis: Principal Problem:   MDD (major depressive disorder), recurrent, severe, with psychosis (HCC) Active Problems:   Suicidal ideation  Long Term Goal(s): Improvement in symptoms so as ready for discharge Improvement in symptoms so as ready  for discharge   Short Term Goals: Ability to identify changes in lifestyle to reduce recurrence of condition will improve Ability to verbalize feelings will improve Ability to disclose and discuss suicidal ideas Ability to demonstrate self-control will improve Ability to identify and develop effective coping behaviors will improve Ability to maintain clinical measurements within normal limits will improve Compliance with prescribed medications will improve Ability to identify triggers associated with substance abuse/mental health issues will improve Ability to identify changes in lifestyle to reduce recurrence of condition will improve Ability to verbalize feelings will improve Ability to disclose and discuss suicidal ideas Ability to demonstrate self-control will improve Ability to identify and develop effective coping behaviors will improve Ability to maintain clinical measurements within normal limits will improve Compliance with prescribed medications will improve Ability to identify triggers associated with substance abuse/mental health issues will improve     Medication Management: Evaluate patient's response, side effects, and tolerance of medication regimen.  Therapeutic Interventions: 1 to 1 sessions, Unit Group sessions and Medication administration.  Evaluation of Outcomes: Adequate for Discharge   RN Treatment Plan for Primary Diagnosis: MDD (major depressive disorder), recurrent, severe, with psychosis (HCC) Long  Term Goal(s): Knowledge of disease and therapeutic regimen to maintain health will improve  Short Term Goals: Ability to identify and develop effective coping behaviors will improve and Compliance with prescribed medications will improve  Medication Management: RN will administer medications as ordered by provider, will assess and evaluate patient's response and provide education to patient for prescribed medication. RN will report any adverse and/or side effects  to prescribing provider.  Therapeutic Interventions: 1 on 1 counseling sessions, Psychoeducation, Medication administration, Evaluate responses to treatment, Monitor vital signs and CBGs as ordered, Perform/monitor CIWA, COWS, AIMS and Fall Risk screenings as ordered, Perform wound care treatments as ordered.  Evaluation of Outcomes: Adequate for Discharge   LCSW Treatment Plan for Primary Diagnosis: MDD (major depressive disorder), recurrent, severe, with psychosis (HCC) Long Term Goal(s): Safe transition to appropriate next level of care at discharge, Engage patient in therapeutic group addressing interpersonal concerns.  Short Term Goals: Engage patient in aftercare planning with referrals and resources, Increase social support and Increase skills for wellness and recovery  Therapeutic Interventions: Assess for all discharge needs, 1 to 1 time with Social worker, Explore available resources and support systems, Assess for adequacy in community support network, Educate family and significant other(s) on suicide prevention, Complete Psychosocial Assessment, Interpersonal group therapy.  Evaluation of Outcomes: Adequate for Discharge   Progress in Treatment: Attending groups: No. Participating in groups: No. Taking medication as prescribed: Yes. Toleration medication: Yes. Family/Significant other contact made: Yes, individual(s) contacted:  mother Patient understands diagnosis: Yes. Discussing patient identified problems/goals with staff: Yes. Medical problems stabilized or resolved: Yes. Denies suicidal/homicidal ideation: Yes. Issues/concerns per patient self-inventory: No. Other: none  New problem(s) identified: No, Describe:  none  New Short Term/Long Term Goal(s):  Patient Goals:  "get the voices in my head to calm down."  Discharge Plan or Barriers:   Reason for Continuation of Hospitalization: None: DC today.  Estimated Length of Stay:DC  today.  Attendees: Patient:Connor Bailey 11/24/2017   Physician: Dr. Altamese [place], MD 11/24/2017   Nursing: Dossie Arbour, RN 11/24/2017   RN Care Manager: 11/24/2017   Social Worker: Daleen Squibb, LCSW 11/24/2017   Recreational Therapist:  11/24/2017   Other:  11/24/2017   Other:  11/24/2017   Other: 11/24/2017      Scribe for Treatment Team: [name], LCSW 11/25/2017 8:26 AM

## 2017-11-25 NOTE — ED Notes (Signed)
Ice water provided.

## 2017-11-25 NOTE — Progress Notes (Signed)
CSW received call from RN.  Pt does not want to wait for Zena[facility] Niece to pick him up and is asking for bus pass wanting to leave now.  CSW spoke with Dr Jola Babinski who is agreeable to this.  CSW provided bus pass.  Pt in a big hurry to leave, stating he cannot wait any longer. Garner Nash, MSW, LCSW Clinical Social Worker 11/25/2017 9:10 AM

## 2017-11-25 NOTE — ED Notes (Signed)
Called BH for update on bed placement; spoke to La Carla; she will have SW call me back.

## 2017-11-25 NOTE — Progress Notes (Signed)
CSW contacted the following hospitals about referral:  [facility]: Have not reviewed yet [facility]: Left message [facility]: "at capacity" [facility]: "at capacity" [facility]: "declined due to medical acuity" [facility]: "at capacity" [facility]: "at capacity" [facility]: "at capacity" [facility]: Have not reviewed yet  Disposition will continue to follow.  Wells Guiles, LCSW, LCAS Disposition CSW [facility]/TTS [phone]

## 2017-11-25 NOTE — ED Notes (Signed)
Cheese provided for pt snack.

## 2017-11-25 NOTE — Progress Notes (Signed)
Pt. meets criteria for inpatient treatment per Assunta Found, NP.  Referred out to the following hospitals:   [facility]  [facility]  [facility]  [facility]  [facility]  [facility]  [facility]  [facility]  [facility]  [facility]  [facility]     Disposition CSW will continue to follow for placement.  Timmothy Euler. Kaylyn Lim, MSW, LCSWA Disposition Clinical Social Work [phone] (cell) [phone] (office)

## 2017-11-25 NOTE — ED Notes (Signed)
Per EDP, pt is not allowed any soda, and is allowed to have water only d/t uncontrolled diabetes.

## 2017-11-25 NOTE — ED Notes (Signed)
Pt ate cheese crackers, chicken noodle soup and white cheddar popcorn; total carbs 46g.

## 2017-11-25 NOTE — ED Notes (Signed)
Disposition: Donell Sievert, NP, patient meets inpatient criteria. TTS to secure placement. RN informed of disposition.

## 2017-11-25 NOTE — ED Notes (Signed)
Pt sleeping on stretcher at this time. Appears to be in NAD.

## 2017-11-25 NOTE — BH Assessment (Addendum)
Tele Assessment Note   Patient Name: Connor Bailey MRN: [id number] Referring Physician: Dr. Particia Nearing Location of Patient: [facility] Bed: [facility] Location of Provider: Behavioral Health TTS Department  Boaz Berisha is an 42 y.o. male presenting with SI with plan to shoot himself with gun and to walk into traffic. During assessment patient became upset with nurse, when asked if he was homicidal, patient stated, "yes I wanna hurt her". Patient denied psychosis. Patient discharged from [facility] earlier today. Patient reported that he was suicidal when he left.[facility]. Patient reported only getting 2 hours of sleep nightly and only having an appetite for 1 meal daily. Patient reported drinking 1x 40 ounce beer daily. Pt states he's from [place], [place] and he came to [place] for live at the [facility]. During tele-assessment patient became belligerent with nurse, security was called. ETOH level 78 and UDS +benzos.  Disposition: Donell Sievert, NP, patient meets inpatient criteria. TTS to secure placement. RN informed of disposition.   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 Alcohol abuse   . Depression   . Diabetes mellitus without complication (HCC)    non-compliant  . Substance abuse [facility])     Past Surgical History:  Procedure Laterality Date  . CLAVICLE SURGERY      Family History:  Family History  Problem Relation Age of Onset  . Fibromyalgia Mother     Social History:  reports that he has been smoking. He has never used smokeless tobacco. He reports that he drinks alcohol. He reports that he has current or past drug history. Drugs: "Crack" cocaine and Morphine. Frequency: 175.00 times per week.  Additional Social History:  Alcohol / Drug Use Pain Medications: see MAR Prescriptions: see MAR Over the Counter: see MAR  CIWA: CIWA-Ar BP: (!) 182/91 Pulse Rate: 89 Nausea and  Vomiting: no nausea and no vomiting Tactile Disturbances: none Tremor: no tremor Auditory Disturbances: not present Paroxysmal Sweats: no sweat visible Visual Disturbances: not present Anxiety: no anxiety, at ease Headache, Fullness in Head: none present Agitation: normal activity Orientation and Clouding of Sensorium: oriented and can do serial additions CIWA-Ar Total: 0 COWS:    Allergies: No Known Allergies  Home Medications:  (Not in a hospital admission)  OB/GYN Status:  No LMP for male patient.  General Assessment Data Location of Assessment: [facility] TTS Assessment: In system Is this a Tele or Face-to-Face Assessment?: Tele Assessment Is this an Initial Assessment or a Re-assessment for this encounter?: Initial Assessment Patient Accompanied by:: N/A Language Other than English: No Living Arrangements: ([facility]) What gender do you identify as?: Male Marital status: Divorced Living Arrangements: Alone([facility]) Can pt return to current living arrangement?: Yes Admission Status: Voluntary Is patient capable of signing voluntary admission?: Yes Referral Source: Self/Family/Friend     Crisis Care Plan Living Arrangements: Alone([facility]) Legal Guardian: (self) Name of Psychiatrist: (none) Name of Therapist: (none)  Education Status Is patient currently in school?: No Highest grade of school patient has completed: Graduated high school Is the patient employed, unemployed or receiving disability?: Unemployed  Risk to self with the past 6 months Suicidal Ideation: Yes-Currently Present Has patient been a risk to self within the past 6 months prior to admission? : Yes Suicidal Intent: Yes-Currently Present Has patient had any suicidal intent within the past 6 months prior to admission? :  Yes Is patient at risk for suicide?: Yes Suicidal Plan?: Yes-Currently Present Has patient had any suicidal plan  within the past 6 months prior to admission? : Yes Specify Current Suicidal Plan: (shoot self with gun or step into traffic) Access to Means: Yes Specify Access to Suicidal Means: (guns on streets and traffic close by) What has been your use of drugs/alcohol within the last 12 months?: (beer daily) Previous Attempts/Gestures: No How many times?: (unknown) Other Self Harm Risks: (unknown) Triggers for Past Attempts: None known Intentional Self Injurious Behavior: None Family Suicide History: No Recent stressful life event(s): (mental illness) Persecutory voices/beliefs?: No Depression: Yes Depression Symptoms: Feeling angry/irritable Substance abuse history and/or treatment for substance abuse?: (unable to assess)  Risk to Others within the past 6 months Homicidal Ideation: Yes-Currently Present Does patient have any lifetime risk of violence toward others beyond the six months prior to admission? : No Thoughts of Harm to Others: Yes-Currently Present Comment - Thoughts of Harm to Others: (during assessment, towards nurse) Current Homicidal Intent: No Current Homicidal Plan: No Identified Victim: (nurse in patient room) History of harm to others?: (unknown) Assessment of Violence: None Noted Does patient have access to weapons?: No Criminal Charges Pending?: No Does patient have a court date: No Is patient on probation?: No  Psychosis Hallucinations: None noted Delusions: None noted  Mental Status Report Appearance/Hygiene: Disheveled Eye Contact: Poor Motor Activity: Restlessness, Shuffling Speech: Aggressive, Argumentative, Pressured Level of Consciousness: Alert Mood: Irritable, Angry Affect: Irritable, Angry Anxiety Level: Moderate Thought Processes: Coherent Judgement: Impaired Orientation: Person, Place, Time, Situation Obsessive Compulsive Thoughts/Behaviors: None  Cognitive Functioning Concentration: Fair Memory: Recent Impaired, Remote Impaired Is patient  IDD: No Insight: Poor Impulse Control: Poor Appetite: Good Have you had any weight changes? : No Change Sleep: No Change Total Hours of Sleep: (2) Vegetative Symptoms: None  ADLScreening [facility] Assessment Services) Patient's cognitive ability adequate to safely complete daily activities?: Yes Patient able to express need for assistance with ADLs?: Yes Independently performs ADLs?: Yes (appropriate for developmental age)  Prior Inpatient Therapy Prior Inpatient Therapy: Yes Prior Therapy Dates: (discharged [facility] today) Prior Therapy Facilty/Provider(s): ([facility]) Reason for Treatment: (mental illness)  Prior Outpatient Therapy Prior Outpatient Therapy: No Does patient have an ACCT team?: No Does patient have Intensive In-House Services?  : No Does patient have [facility] services? : No Does patient have P4CC services?: No  ADL Screening (condition at time of admission) Patient's cognitive ability adequate to safely complete daily activities?: Yes Patient able to express need for assistance with ADLs?: Yes Independently performs ADLs?: Yes (appropriate for developmental age)             [employer]chant [employer] officer (For Healthcare) Does Patient Have a Medical Advance Directive?: No          Disposition:  Disposition Initial Assessment Completed for this Encounter: Yes  Donell Sievert, NP, patient meets inpatient criteria. TTS to secure placement. RN informed of disposition.  This service was provided via telemedicine using a 2-way, interactive audio and video technology.  Names of all persons participating in this telemedicine service and their role in this encounter. Name: Connor Bailey Role: patient  Name: Al Corpus, [facility] Role: TTS Clinician  Name:  Role:  Name:  Role:     Burnetta Sabin 11/25/2017 4:56 AM

## 2017-11-25 NOTE — Consult Note (Signed)
Telepsych Consultation   Reason for Consult:  Suicidal ideation Referring Physician: Isla Pence, MD Location of Patient: [facility] Location of Provider: [facility]  Patient Identification: Connor Bailey MRN:  [id number] Principal Diagnosis: MDD (major depressive disorder), recurrent episode, severe ([place]) Diagnosis:   Patient Active Problem List   Diagnosis Date Noted  . MDD (major depressive disorder), recurrent episode, severe ([place]) [F33.2] 11/25/2017  . MDD (major depressive disorder), recurrent, severe, with psychosis ([place]) [F33.3] 11/21/2017  . Alcohol-induced mood disorder ([place]) [F10.94]   . Suicidal ideation [R45.851]   . Cough [R05] 11/18/2017  . Essential hypertension [I10] 11/18/2017  . Psychiatric diagnosis [F99] 11/18/2017  . Type 2 diabetes mellitus with hyperlipidemia ([place]) [E11.69, E78.5] 11/18/2017  . Renal insufficiency [N28.9]   . Diabetic ketoacidosis without coma associated with type 2 diabetes mellitus ([place]) [E11.10] 11/16/2017    Total Time spent with patient: 30 minutes  Subjective:   Connor Bailey is a 42 y.o. male patient presented to [facility] with complaints of suicidal ideation and a plan to shoot himself with gun and to walk into traffic.  Patient discharged from [facility] earlier on the same day    Per Chart review [facility] admission HPI assessment 11/21/17 :  History of Present Illness: Patient is seen and examined. Patient is a 42 year old male with a past psychiatric history significant for unspecified psychotic illness, alcohol use disorder/dependence, history of opioid use/dependence, reported history of depression with psychotic features, poorly controlled type 2 diabetes mellitus who presented to the [facility] emergency department on 11/16/2017 originally with upper respiratory tract symptoms. He had moved from the [place] and had to come here to be in a rehabilitation facility. During the work-up he was found to  be in diabetic ketoacidosis, and was hospitalized. He was discharged on 11/18/2017. He was to secure a ride back to his sober living house. On 10/24 he really presented to the [facility] emergency department with suicidal ideation. He was evaluated then and stated that he was suicidal. He stated he would do "the easiest thing". The patient reported drinking daily and he would drink 40 ounce beers. He also had been hospitalized on 11/07/2017 at the violent health facility in [place]. He was hospitalized there from 1011 03/09/2016. At that time he reported auditory hallucinations, anxiety, depression and suicidal ideation. He was reportedly trying to stick things in electrical outlets in an attempt to kill himself. He was discharged there on 11/14/2017. He apparently was only out of hospital treatment for 2 days before presenting to the [facility] admission as noted above. He also had been [facility] to a [facility] on 10/13/2017 to 10/21/2017. He presented at that time with a history of bipolar disorder, polysubstance use disorder and alcohol withdrawal. He stated he was having suicidal thoughts with no plan but did state he attempted to overdose on methamphetamines a few weeks prior to admission. It also sounded as though he had extraparametal side effects to the risk for doll and had some drooling. He had some aggressive behavior at that time as well. From a psychiatric perspective he was discharged on Risperdal, hydroxyzine, folic acid. He was admitted to our facility for evaluation and stabilization.  Patient states that he mediation that doesn't work is Risperdal.  Patient also states "Nobody will give me nothing that works. They won't give me Valium or nothing like that."   Patient informed that try to avoid long term use of benzodiazepines related to possibility of  dependence.    Labs: Abnormal CBC, ETOH 75; UDS + Benzo; UA + bacteria; glucose  HPI:   Connor Bailey, 42 y.o., male patient with history of uncontrolled Type 2 DM, HTN seen via telepsych by this provider; chart reviewed and consulted with Dr. Dwyane Dee on 11/25/17.  On evaluation Connor Bailey reports "I came back cause I was going to commit suicide.  I lied to them yesterday.  Everywhere I go they start me on the same damn medication that doesn't work.  They don't care; I just wanted to leave cause they won't doing shit."  Patient states he has had several psychiatric hospitalizations and has not set up outpatient services after any of the visits.  States that when his medication is running out he is usually suicidal again and comes back to the hospital. Patient continues to endorse that he is suicidal "If I leave this hospital I'm going to jump in front of a car or shot myself; I really mean it."  Patient states that he has a way to get gun.  Patient states that he has been staying at [facility].  At this time patient denies homicidal ideation, psychosis, and paranoia.      Past Psychiatric History: GAD, Depression, opiate dependence, alcohol dependence; multiple psychiatric admission.  Patient recently discharged from [facility] 11/21/17 - 11/24/17 to follow up at [facility] for outpatient services  Risk to Self: Suicidal Ideation: Yes-Currently Present Suicidal Intent: Yes-Currently Present Is patient at risk for suicide?: Yes Suicidal Plan?: Yes-Currently Present Specify Current Suicidal Plan: (shoot self with gun or step into traffic) Access to Means: Yes Specify Access to Suicidal Means: (guns on streets and traffic close by) What has been your use of drugs/alcohol within the last 12 months?: (beer daily) How many times?: (unknown) Other Self Harm Risks: (unknown) Triggers for Past Attempts: None known Intentional Self Injurious Behavior: None Risk to Others: Homicidal Ideation: Yes-Currently Present Thoughts of Harm to Others: Yes-Currently Present Comment - Thoughts of  Harm to Others: (during assessment, towards nurse) Current Homicidal Intent: No Current Homicidal Plan: No Identified Victim: (nurse in patient room) History of harm to others?: (unknown) Assessment of Violence: None Noted Does patient have access to weapons?: No Criminal Charges Pending?: No Does patient have a court date: No Prior Inpatient Therapy: Prior Inpatient Therapy: Yes Prior Therapy Dates: (discharged [facility] today) Prior Therapy Facilty/Provider(s): ([facility]) Reason for Treatment: (mental illness) Prior Outpatient Therapy: Prior Outpatient Therapy: No Does patient have an ACCT team?: No Does patient have Intensive In-House Services?  : No Does patient have [facility] services? : No Does patient have P4CC services?: No  Past Medical History:  Past Medical History:  Diagnosis Date  . Alcohol abuse   . Depression   . Diabetes mellitus without complication (HCC)    non-compliant  . Substance abuse [facility])     Past Surgical History:  Procedure Laterality Date  . CLAVICLE SURGERY     Family History:  Family History  Problem Relation Age of Onset  . Fibromyalgia Mother    Family Psychiatric  History: unaware Social History:  Social History   Substance and Sexual Activity  Alcohol Use Yes  . Frequency: Never     Social History   Substance and Sexual Activity  Drug Use Yes  . Frequency: 175.0 times per week  . Types: "Crack" cocaine, Morphine    Social History   Socioeconomic History  . Marital status: Single    Spouse name: unknown  .  Number of children: 0  . Years of education: 0  . Highest education level: 9th grade  Occupational History  . Not on file  Social Needs  . Financial resource strain: Not hard at all  . Food insecurity:    Worry: Patient refused    Inability: Patient refused  . Transportation needs:    Medical: Patient refused    Non-medical: Patient refused  Tobacco Use  . Smoking status: Current Every Day Smoker  . Smokeless tobacco:  Never Used  Substance and Sexual Activity  . Alcohol use: Yes    Frequency: Never  . Drug use: Yes    Frequency: 175.0 times per week    Types: "Crack" cocaine, Morphine  . Sexual activity: Not Currently    Birth control/protection: None  Lifestyle  . Physical activity:    Days per week: Patient refused    Minutes per session: Patient refused  . Stress: Not on file  Relationships  . Social connections:    Talks on phone: Not on file    Gets together: Not on file    Attends religious service: Not on file    Active member of club or organization: Not on file    Attends meetings of clubs or organizations: Not on file    Relationship status: Not on file  Other Topics Concern  . Not on file  Social History Narrative  . Not on file   Additional Social History:    Allergies:  No Known Allergies  Labs:  Results for orders placed or performed during the hospital encounter of 11/24/17 (from the past 48 hour(s))  CBG monitoring, ED     Status: Abnormal   Collection Time: 11/24/17  8:18 PM  Result Value Ref Range   Glucose-Capillary >600 (HH) 70 - 99 mg/dL  CBC     Status: Abnormal   Collection Time: 11/24/17  8:27 PM  Result Value Ref Range   WBC 9.3 4.0 - 10.5 K/uL   RBC 3.97 (L) 4.22 - 5.81 MIL/uL   Hemoglobin 10.6 (L) 13.0 - 17.0 g/dL   HCT 34.2 (L) 39.0 - 52.0 %   MCV 86.1 80.0 - 100.0 fL   MCH 26.7 26.0 - 34.0 pg   MCHC 31.0 30.0 - 36.0 g/dL   RDW 15.9 (H) 11.5 - 15.5 %   Platelets 635 (H) 150 - 400 K/uL   nRBC 0.0 0.0 - 0.2 %    Comment: Performed at [facility], [address] [place], [place] [address]  Urinalysis, Routine w reflex microscopic     Status: Abnormal   Collection Time: 11/24/17  8:27 PM  Result Value Ref Range   Color, Urine YELLOW YELLOW   APPearance CLEAR CLEAR   Specific Gravity, Urine <1.005 (L) 1.005 - 1.030   pH 6.0 5.0 - 8.0   Glucose, UA >=500 (A) NEGATIVE mg/dL   Hgb urine dipstick NEGATIVE NEGATIVE   Bilirubin Urine  NEGATIVE NEGATIVE   Ketones, ur NEGATIVE NEGATIVE mg/dL   Protein, ur NEGATIVE NEGATIVE mg/dL   Nitrite NEGATIVE NEGATIVE   Leukocytes, UA NEGATIVE NEGATIVE    Comment: Performed at [facility], [address] [place], [place] [address]  Comprehensive metabolic panel     Status: Abnormal   Collection Time: 11/24/17  8:27 PM  Result Value Ref Range   Sodium 129 (L) 135 - 145 mmol/L   Potassium 4.8 3.5 - 5.1 mmol/L   Chloride 96 (L) 98 - 111 mmol/L  CO2 22 22 - 32 mmol/L   Glucose, Bld 843 (HH) 70 - 99 mg/dL    Comment: RESULTS CONFIRMED BY MANUAL DILUTION CRITICAL RESULT CALLED TO, READ BACK BY AND VERIFIED WITH: LEBRON,Y AT 2112 ON [id number] BY CHERESNOWSKY,T    BUN 17 6 - 20 mg/dL   Creatinine, Ser 1.36 (H) 0.61 - 1.24 mg/dL   Calcium 8.8 (L) 8.9 - 10.3 mg/dL   Total Protein 6.8 6.5 - 8.1 g/dL   Albumin 3.4 (L) 3.5 - 5.0 g/dL   AST 13 (L) 15 - 41 U/L   ALT 11 0 - 44 U/L   Alkaline Phosphatase 102 38 - 126 U/L   Total Bilirubin 0.3 0.3 - 1.2 mg/dL   GFR calc non Af Amer >60 >60 mL/min   GFR calc Af Amer >60 >60 mL/min    Comment: (NOTE) The eGFR has been calculated using the CKD EPI equation. This calculation has not been validated in all clinical situations. eGFR's persistently <60 mL/min signify possible Chronic Kidney Disease.    Anion gap 11 5 - 15    Comment: Performed at [facility], [address] [place], [place] [address]  Ethanol     Status: Abnormal   Collection Time: 11/24/17  8:27 PM  Result Value Ref Range   Alcohol, Ethyl (B) 78 (H) <10 mg/dL    Comment: (NOTE) Lowest detectable limit for serum alcohol is 10 mg/dL. For medical purposes only. Performed at [facility], [address] [place], [place] [address]   Salicylate level     Status: None   Collection Time: 11/24/17  8:27 PM  Result Value Ref Range   Salicylate Lvl <7.8 2.8 - 30.0 mg/dL    Comment: Performed at [facility], [address]  [place], [place] [address]  Acetaminophen level     Status: Abnormal   Collection Time: 11/24/17  8:27 PM  Result Value Ref Range   Acetaminophen (Tylenol), Serum 34 (H) 10 - 30 ug/mL    Comment: (NOTE) Therapeutic concentrations vary significantly. A range of 10-30 ug/mL  may be an effective concentration for many patients. However, some  are best treated at concentrations outside of this range. Acetaminophen concentrations >150 ug/mL at 4 hours after ingestion  and >50 ug/mL at 12 hours after ingestion are often associated with  toxic reactions. Performed at [facility], [address] [place], [place] [address]   Rapid urine drug screen (hospital performed)     Status: Abnormal   Collection Time: 11/24/17  8:27 PM  Result Value Ref Range   Opiates NONE DETECTED NONE DETECTED   Cocaine NONE DETECTED NONE DETECTED   Benzodiazepines POSITIVE (A) NONE DETECTED   Amphetamines NONE DETECTED NONE DETECTED   Tetrahydrocannabinol NONE DETECTED NONE DETECTED   Barbiturates NONE DETECTED NONE DETECTED    Comment: (NOTE) DRUG SCREEN FOR MEDICAL PURPOSES ONLY.  IF CONFIRMATION IS NEEDED FOR ANY PURPOSE, NOTIFY LAB WITHIN 5 DAYS. LOWEST DETECTABLE LIMITS FOR URINE DRUG SCREEN Drug Class                     Cutoff (ng/mL) Amphetamine and metabolites    1000 Barbiturate and metabolites    200 Benzodiazepine                 962 Tricyclics and metabolites     300 Opiates and metabolites        300 Cocaine and  metabolites        300 THC                            50 Performed at [facility], [address] [place], [place] [address]   Urinalysis, Microscopic (reflex)     Status: Abnormal   Collection Time: 11/24/17  8:27 PM  Result Value Ref Range   RBC / HPF 0-5 0 - 5 RBC/hpf   WBC, UA NONE SEEN 0 - 5 WBC/hpf   Bacteria, UA RARE (A) NONE SEEN   Squamous Epithelial / LPF 0-5 0 - 5   Ca Oxalate Crys, UA PRESENT     Comment: Performed at [facility], [address] [place], [place] [address]  POC CBG, ED     Status: Abnormal   Collection Time: 11/24/17 10:07 PM  Result Value Ref Range   Glucose-Capillary 556 (HH) 70 - 99 mg/dL  POC CBG, ED     Status: Abnormal   Collection Time: 11/24/17 11:10 PM  Result Value Ref Range   Glucose-Capillary 282 (H) 70 - 99 mg/dL  POC CBG, ED     Status: None   Collection Time: 11/25/17  7:42 AM  Result Value Ref Range   Glucose-Capillary 87 70 - 99 mg/dL   Comment 1 Notify RN   POC CBG, ED     Status: Abnormal   Collection Time: 11/25/17  9:24 AM  Result Value Ref Range   Glucose-Capillary 104 (H) 70 - 99 mg/dL  POC CBG, ED     Status: Abnormal   Collection Time: 11/25/17 12:19 PM  Result Value Ref Range   Glucose-Capillary 297 (H) 70 - 99 mg/dL   Comment 1 Notify RN     Medications:  Current Facility-Administered Medications  Medication Dose Route Frequency Provider Last Rate Last Dose  . alum & mag hydroxide-simeth (MAALOX/MYLANTA) 200-200-20 MG/5ML suspension 30 mL  30 mL Oral Q6H PRN Isla Pence, MD      . gabapentin (NEURONTIN) capsule 300 mg  300 mg Oral TID Isla Pence, MD   300 mg at 11/25/17 1017  . hydrOXYzine (ATARAX/VISTARIL) tablet 25 mg  25 mg Oral Q6H PRN Isla Pence, MD      . insulin aspart (novoLOG) injection 0-9 Units  0-9 Units Subcutaneous TID WC Deno Etienne, DO   5 Units at 11/25/17 1237  . insulin aspart (novoLOG) injection 1-10 Units  1-10 Units Subcutaneous TID AC & HS Tyrone Nine, Dan, DO      . insulin glargine (LANTUS) injection 39 Units  39 Units Subcutaneous QHS Isla Pence, MD   39 Units at 11/25/17 0004  . loperamide (IMODIUM) capsule 4 mg  4 mg Oral PRN Deno Etienne, DO   4 mg at 11/25/17 1133  . LORazepam (ATIVAN) injection 0-4 mg  0-4 mg Intravenous Q6H Isla Pence, MD       Or  . LORazepam (ATIVAN) tablet 0-4 mg  0-4 mg Oral Q6H Isla Pence, MD      . Derrill Memo ON 11/27/2017] LORazepam (ATIVAN) injection 0-4 mg  0-4 mg Intravenous Q12H Isla Pence, MD       Or  . Derrill Memo ON 11/27/2017] LORazepam (ATIVAN) tablet 0-4 mg  0-4 mg Oral Q12H Isla Pence, MD      . metoprolol succinate (TOPROL-XL) 24 hr tablet 25 mg  25 mg Oral Daily Isla Pence, MD   25 mg at  11/25/17 1017  . nicotine (NICODERM CQ - dosed in mg/24 hours) patch 21 mg  21 mg Transdermal Daily Isla Pence, MD      . ondansetron [facility]) tablet 4 mg  4 mg Oral Q8H PRN Isla Pence, MD      . pravastatin (PRAVACHOL) tablet 40 mg  40 mg Oral QHS Isla Pence, MD      . thiamine (VITAMIN B-1) tablet 100 mg  100 mg Oral Daily Isla Pence, MD   100 mg at 11/25/17 1017   Or  . thiamine (B-1) injection 100 mg  100 mg Intravenous Daily Isla Pence, MD       Current Outpatient Medications  Medication Sig Dispense Refill  . amoxicillin (AMOXIL) 250 MG/5ML suspension Take 10 mLs (500 mg total) by mouth 2 (two) times daily. 200 mL 0  . diazepam (VALIUM) 5 MG tablet Take 1 tablet (5 mg total) by mouth 3 (three) times daily. 10 tablet 0  . gabapentin (NEURONTIN) 300 MG capsule Take 1 capsule (300 mg total) by mouth 3 (three) times daily. 90 capsule 0  . hydrOXYzine (ATARAX/VISTARIL) 25 MG tablet Take 1 tablet (25 mg total) by mouth every 6 (six) hours as needed for itching or anxiety. 30 tablet 0  . insulin aspart (NOVOLOG) 100 UNIT/ML injection Inject 0-10 Units into the skin 3 (three) times daily before meals.    . insulin glargine (LANTUS) 100 UNIT/ML injection Inject 39 Units into the skin at bedtime.    . metoprolol succinate (TOPROL-XL) 25 MG 24 hr tablet Take 1 tablet (25 mg total) by mouth daily. 30 tablet 0  . nicotine polacrilex (NICORETTE) 2 MG gum Take 1 each (2 mg total) by mouth as needed for smoking cessation. 100 tablet 0  . pravastatin (PRAVACHOL) 40 MG tablet Take 40 mg by mouth at bedtime.   0  . sulfamethoxazole-trimethoprim (BACTRIM,SEPTRA) 400-80 MG tablet Take 1 tablet by mouth every 12 (twelve) hours. 6 tablet 0  . traZODone (DESYREL) 100  MG tablet Take 2 tablets (200 mg total) by mouth at bedtime and may repeat dose one time if needed. 60 tablet 0    Musculoskeletal: Strength & Muscle Tone: within normal limits Gait & Station: normal Patient leans: N/A  Psychiatric Specialty Exam: Physical Exam  Review of Systems  Neurological:       Constant moving of mouth and shoulders; possible related to prior antipsychotic medication; but patient states that he has never been on long term antipsychotic "It's just a nervious tick I have."     Blood pressure (!) 186/114, pulse 89, temperature 97.8 F (36.6 C), resp. rate 20, height 5' 9"  (1.753 m), weight 81.6 kg, SpO2 99 %.Body mass index is 26.57 kg/m.  General Appearance: Casual  Eye Contact:  Good  Speech:  Clear and Coherent and Normal Rate  Volume:  Normal  Mood:  Anxious and Depressed  Affect:  Congruent  Thought Process:  Coherent and Goal Directed  Orientation:  Full (Time, Place, and Person)  Thought Content:  Logical  Suicidal Thoughts:  Yes.  with intent/plan  Homicidal Thoughts:  No  Memory:  Immediate;   Good Recent;   Fair Remote;   Fair  Judgement:  Fair  Insight:  Lacking and Shallow  Psychomotor Activity:  abnormal movement of shoulders and mouth  Concentration:  Concentration: Good and Attention Span: Good  Recall:  Good  Fund of Knowledge:  Good  Language:  Good  Akathisia:  No  Handed:  Right  AIMS (if indicated):     Assets:  Communication Skills Desire for Improvement  ADL's:  Intact  Cognition:  WNL  Sleep:        Treatment Plan Summary: Plan Inpatient psychiatric treatment.   Disposition: Recommend psychiatric Inpatient admission when medically cleared.  This service was provided via telemedicine using a 2-way, interactive audio and video technology.  Names of all persons participating in this telemedicine service and their role in this encounter. Name: Earleen Newport, NP Role: Tele psych assessment  Name: Dr. Dwyane Dee Role:  Psychiatrist  Name: Connor Bailey Role: Patient  Name: Dr. Tyrone Nine Role: Informed of above recommendation and disposition    Kania Regnier, NP 11/25/2017 1:57 PM

## 2017-11-25 NOTE — ED Notes (Addendum)
Security at bedside due to aggressive behavior.

## 2017-11-25 NOTE — ED Notes (Signed)
Patient ambulated to restroom; steady gait observed. 

## 2017-11-25 NOTE — ED Notes (Signed)
TTS consult in progress. °

## 2017-11-26 ENCOUNTER — Encounter (HOSPITAL_BASED_OUTPATIENT_CLINIC_OR_DEPARTMENT_OTHER): Payer: Self-pay | Admitting: Registered Nurse

## 2017-11-26 LAB — CBG MONITORING, ED
GLUCOSE-CAPILLARY: 193 mg/dL — AB (ref 70–99)
GLUCOSE-CAPILLARY: 122 mg/dL — AB (ref 70–99)
GLUCOSE-CAPILLARY: 217 mg/dL — AB (ref 70–99)
Glucose-Capillary: 79 mg/dL (ref 70–99)

## 2017-11-26 MED ORDER — INSULIN GLARGINE 100 UNIT/ML ~~LOC~~ SOLN
SUBCUTANEOUS | Status: AC
  Filled 2017-11-26: qty 1

## 2017-11-26 MED ORDER — HALOPERIDOL 5 MG PO TABS
ORAL_TABLET | ORAL | Status: AC
  Administered 2017-11-26: 5 mg
  Filled 2017-11-26: qty 1

## 2017-11-26 NOTE — BHH Counselor (Signed)
Pt is currently stating that if he is D/C from the ED he will "kill himself." Denice Bors, NP is currently recommending inpatient treatment. TTS will begin to seek inpatient treatment for the Pt.  Wolfgang Phoenix, [facility] Triage Specialist

## 2017-11-26 NOTE — Progress Notes (Signed)
TTS team attempted to see if patient could return to [place], which is where he is from originally.  Pt's mother was contacted by TTS Counselor, Luetta Nutting, LPC and mother is reported to refuse to let patient come back to her home.  Mother noted that family had been supportive of pt coming to sober living house in [place] and had raised enough money for him to come to [place] and pay for the initial stay in that milieu.  However, since patient left the sober living house, family is refusing to allow him to come live with them.    Per TTS Counselor, Luetta Nutting., LPC, it was proposed to patient that he go either to the [facility] in [place], the [facility], [facility] [employer], or the [facility].  Patient currently agrees to go to a shelter but is asking for information on what he needs to do to get in.  TTS and Disposition CSW will contact the four shelters to ask about placement availability.    [facility] in [place], [place]- TTS counselor, Luetta Nutting, spoke with Director who indicated that they did have a bed available today.  Patient is concerned because his clothing, medication and personal belongings are still at the sober living facility, [facility].  CSW and TTS Counselor contacted [facility] and spoke to the house leader, who agreed to bring pt's belongings sometime later this afternoon, or this evening, he could not say when specifically.  The house manager indicated that they gave patient his medications when he left their facility earlier in the week.  CSW has asked that patient be d/c'd with a weeks worth of meds when he discharges from the hospital.    When we talked to the patient and let him know that there is current bed availability but that we could not guarantee that his belongings will be delivered before he would leave to go to [facility], he stated "I will kill myself if I leave  this hospital without my belongings."  This statement was reported to [facility] Physician Extender, Assunta Found, NP, who agree that patient status needs to be changed back to inpatient and he can be re-assessed tomorrow now that there is a plausible plan for safe discharge.  CSW will follow up with [facility] in the morning to check bed availability.  Patient should have his belongings by tonight.  [facility] has agreed to give pt a cab voucher tomorrow to get him to whatever placement we find for him.  Timmothy Euler. Kaylyn Lim, MSW, LCSWA Disposition Clinical Social Work [phone] (cell) [phone] (office)

## 2017-11-26 NOTE — ED Notes (Signed)
Pt given mac and cheese, meatloaf TV dinner and peanut butter crackers. Total of 100carbs

## 2017-11-26 NOTE — ED Notes (Signed)
Blood sugar 122

## 2017-11-26 NOTE — ED Notes (Signed)
Pt is agitated, raising voice to staff.  Pt angry that this nurse moved his table to speak to him.  Pt states "I had my table just like I wanted and you moved it.  Leave me alone!"

## 2017-11-26 NOTE — ED Notes (Signed)
CBG 79 

## 2017-11-26 NOTE — Consult Note (Signed)
  Tele Assessment   Connor Bailey, 42 y.o., male patient seen via telepsych for reassessment by this provider; chart reviewed and consulted with Dr. Lucianne Muss on 11/26/17.  On evaluation Arran Fessel reports that he is feeling a little better.  States if he is able to get back home with family that he would not be suicidal.  Patient states that he is able to get outpatient psychiatric services in [place] and would have more help with transportation.; but he does not have the money to get back home.  At this time patient denies suicidal ideation if he can get back to [place].  Patient also denies homicidal ideation, psychosis, and paranoia.  Patient does have worsening of movement of tongue protruding in/out of mouth, abnormal movement of shoulders, neck and arm.  Unable to determine if lower body has abnormal movement also.  Patient states that he does not have a history of taking antipsychotics or other medications that could cause the movements that he states has been occurring for months.  Patient states it is related to his nerves.  Movement appear to be dyskinesia or tardive dyskinesia.   During evaluation Jadarrius Maselli is alert/oriented x 4; calm/cooperative; and mood congruent with affect.  He does not appear to be responding to internal/external stimuli or delusional thoughts.  Patient denied suicidal/self-harm/homicidal ideation, psychosis, and paranoia.  Patient answered question appropriately.  Patient states that he would be able to stay with a friend.  Patient will get the number from his phone and give to the nurse for collateral and follow up.  At this current time patient denies suicidal/homicidal ideation, psychosis, and paranoia.   Recommendations:  Outpatient psychiatric services  Disposition:  Patient psychiatric cleared No evidence of imminent risk to self or others at present.   Patient does not meet criteria for psychiatric inpatient admission. Supportive therapy provided about  ongoing stressors. Discussed crisis plan, support from social network, calling 911, coming to the Emergency Department, and calling Suicide Hotline.  Assunta Found, NP

## 2017-11-26 NOTE — ED Notes (Signed)
Pt states they feel anxious. Tolerated po meal. Resting on stretcher. copperative

## 2017-11-26 NOTE — ED Notes (Signed)
Pt yelling at TTS person in room.  Pt is belligerent, states "I don't have no place to go!"  Pt continues to yell at [facility] via TTS.

## 2017-11-27 ENCOUNTER — Encounter (HOSPITAL_BASED_OUTPATIENT_CLINIC_OR_DEPARTMENT_OTHER): Payer: Self-pay | Admitting: Registered Nurse

## 2017-11-27 LAB — CBG MONITORING, ED
GLUCOSE-CAPILLARY: 48 mg/dL — AB (ref 70–99)
GLUCOSE-CAPILLARY: 190 mg/dL — AB (ref 70–99)
Glucose-Capillary: 263 mg/dL — ABNORMAL HIGH (ref 70–99)
Glucose-Capillary: 177 mg/dL — ABNORMAL HIGH (ref 70–99)

## 2017-11-27 MED ORDER — TRAZODONE HCL 50 MG PO TABS
150.0000 mg | ORAL_TABLET | Freq: Every day | ORAL | Status: DC
  Administered 2017-11-27: 150 mg via ORAL
  Filled 2017-11-27: qty 1

## 2017-11-27 MED ORDER — HALOPERIDOL 5 MG PO TABS
5.0000 mg | ORAL_TABLET | Freq: Once | ORAL | Status: AC
  Administered 2017-11-27: 5 mg via ORAL
  Filled 2017-11-27: qty 1

## 2017-11-27 MED ORDER — INSULIN ASPART 100 UNIT/ML ~~LOC~~ SOLN
0.0000 [IU] | Freq: Three times a day (TID) | SUBCUTANEOUS | Status: DC
  Administered 2017-11-27: 3 [IU] via SUBCUTANEOUS
  Filled 2017-11-27 (×2): qty 1

## 2017-11-27 MED ORDER — ZIPRASIDONE MESYLATE 20 MG IM SOLR
20.0000 mg | Freq: Once | INTRAMUSCULAR | Status: AC | PRN
  Administered 2017-11-27: 20 mg via INTRAMUSCULAR
  Filled 2017-11-27: qty 20

## 2017-11-27 MED ORDER — STERILE WATER FOR INJECTION IJ SOLN
INTRAMUSCULAR | Status: AC
  Administered 2017-11-27: 10 mL
  Filled 2017-11-27: qty 10

## 2017-11-27 MED ORDER — IBUPROFEN 200 MG PO TABS
600.0000 mg | ORAL_TABLET | Freq: Four times a day (QID) | ORAL | Status: DC | PRN
  Administered 2017-11-27: 600 mg via ORAL
  Filled 2017-11-27: qty 3

## 2017-11-27 MED ORDER — SULFAMETHOXAZOLE-TRIMETHOPRIM 400-80 MG PO TABS
1.0000 | ORAL_TABLET | Freq: Two times a day (BID) | ORAL | Status: DC
  Administered 2017-11-27 – 2017-11-28 (×2): 1 via ORAL
  Filled 2017-11-27 (×2): qty 1

## 2017-11-27 MED ORDER — GUAIFENESIN 100 MG/5ML PO SOLN
5.0000 mL | ORAL | Status: DC | PRN
  Administered 2017-11-27: 100 mg via ORAL
  Filled 2017-11-27: qty 10

## 2017-11-27 MED ORDER — DIPHENHYDRAMINE HCL 50 MG/ML IJ SOLN
50.0000 mg | Freq: Once | INTRAMUSCULAR | Status: AC | PRN
  Administered 2017-11-27: 50 mg via INTRAMUSCULAR
  Filled 2017-11-27: qty 1

## 2017-11-27 MED ORDER — INSULIN ASPART 100 UNIT/ML ~~LOC~~ SOLN: 0.0000 [IU] | Freq: Three times a day (TID) | SUBCUTANEOUS | Status: DC

## 2017-11-27 NOTE — ED Notes (Signed)
On admission to Acute Unit pt is cooperative, a little anxious appearing. He requested a shot for anxiety. Informed that Geodon and Benadryl are ordered and he said, "Good, that is what they usually give me. I need that for sleep." He has been trying to call his mother and she is not answering the telephone. Pt states that he came from [place] and has been staying with "friends" in [place]. They kicked him out. While writing this note, pt walked up to the window and said, "What's the deal Mam?"  Reassured him that medications are being prepared.

## 2017-11-27 NOTE — ED Notes (Signed)
Patient ate all snacks given. No complaints voiced.

## 2017-11-27 NOTE — ED Notes (Signed)
Gave patient graham crackers and peanut butter and grits and diet coke.

## 2017-11-27 NOTE — Progress Notes (Addendum)
CSW contacted [facility] [phone]) and was told there is one bed available on a first come, first serve basis.  CSW contacted patient's RN, Tonette Bihari, who verified that patient's clothes and other belongings were delivered to the hospital by the staff from his sober living house.  CSW has asked that patient be re-evaluated by [facility] Physician Extender, Assunta Found, NP.  Yesterday patient told this Clinical research associate and TTS Counsel, Brandi L, LPC, that he would kill himself if he was discharged without a place to stay. We explored many options yesterday, including the patient returning to [place], [place], his home town. However, our contact with patient's mother and the person he identifies as his "best friend"  Both indicated that patient could not come back to live with either party.  Patient is likely seeking secondary gain by telling s he is suicidal.  During his telepsych today, the proposal to go to [facility] was presented to patient.  Patient vehemently refused the assistance to get to the facility.  Stating "If you send me there I will kill myself!"  Patient was reminded that he had agreed to this plan yesterday, then began raising his voice, insisting he "just want(ed) to go to [place]."  Patient asserted that he could go "stay with my best friend."  When this writer told him that both his mother and friend had declined him coming back to live with them, he began yelling "I'm going to kill myself!  You aren't listening to me!  I'm going to kill myself!"  This Clinical research associate told patient that I would continue to look for a treatment bed.    This Clinical research associate then call and spoke to Dr. Jacqulyn Bath, EDP, and suggested that he contact the EDP's of [facility] and request to transfer the patient to one of those ED's due to his aggressive behavior in the [facility] ED.  Dr. Jacqulyn Bath is considering that suggestion.  Timmothy Euler. Kaylyn Lim, MSW, LCSWA Disposition Clinical Social Work [phone]  (cell) [phone] (office)

## 2017-11-27 NOTE — ED Provider Notes (Signed)
Blood pressure (!) 191/94, pulse 82, temperature 98.4 F (36.9 C), temperature source Oral, resp. rate 20, height 5\' 9"  (1.753 m), weight 81.6 kg, SpO2 98 %.  In short, Connor Bailey is a 42 y.o. male with a chief complaint of Hyperglycemia and Suicidal .  Refer to the original H&P for additional details.  9:37 AM Discussed the case with behavioral health.  They reevaluated the patient this morning who continues to be agitated and endorsed suicidal ideation.  Have not successfully found a placement for the patient at this time.  They recommend transfer to either [facility] or [facility] for further treatment.  Patient has been here in the emergency department for 61 hours.  Will discuss transfer.  I do plan on filing IVC paperwork as the patient has immediately been yelling at staff and behavioral health that "well I'll just kill myself then. I'm not kidding!"   10:27 AM Spoke with EDP Dr. Jeraldine Loots at [facility]. He will accept the patient in ED-ED transfer. Appreciate assistance. BH to follow there.   Alona Bene, MD   Maia Plan, MD 11/27/17 1027

## 2017-11-27 NOTE — ED Notes (Signed)
Blood sugar 177

## 2017-11-27 NOTE — ED Notes (Signed)
Pt ate his entire dinner and then asked for graham crackers.

## 2017-11-27 NOTE — ED Notes (Signed)
Pt had reassessment by [facility]. Pt refused to go to [facility]. [facility] assessment pt is pending bed placement for [facility].

## 2017-11-27 NOTE — ED Notes (Signed)
On admission to the Acute Unit pt was given a ham sandwich, crackers, cheese stick and two diet cokes. Just now he was given cough syrup, a Vistaril for anxiety and a pitcher of water. Pt demanding more food.

## 2017-11-27 NOTE — ED Notes (Signed)
Pt asking for diet coke and something to eat, informed that he can have water and some crackers until breakfast in the morning.

## 2017-11-27 NOTE — Consult Note (Signed)
Tele Assessment   Connor Bailey, 42 y.o., male patient  Reassessed telepsych by this provider; chart reviewed and consulted with Dr. Lucianne Muss on 11/27/17.  Patient was psychiatrically cleared yesterday for a bed available at [facility] in [place]; [place]  but when patient thought he would not get his belongings prior to discharged stated that he would kill himself if he was discharged prior to getting his belongs.  On evaluation Connor Bailey reports that he wants to go back to [place]; patient informed that there had to be a safety plan in place before we sent him any where; and that his mother, siblings, and his friend stated that he could not come back to [place] and stay with any of them.  Patient stated that he talked to his mother and his mother told him that she has talked to his friend and  that he could stay with him; Patient informed that his mother and family informed that he was sent to [facility] to get help and did not do it and that they do not want to enable him anymore.  Patient then informed that there was a bed available at [facility] where he would have somewhere to stay and work on getting to his outpatient psychiatric services.  Patient now angry and yelling stating that if he is discharged that he would kill himself.  I ain't going to no fucking [facility]  and if ya'll discharge me I'll fucking kill myself.  Keep not believing me when I say I'll do it.  I want to die and if I'm discharged; I'll fucking kill myself today.       During evaluation [name] is alert/oriented x 4; calm/cooperative at beginning of assessment and then angry when informed that he could not be sent back to [place] without any where to go or a safety plan.  Patient then began to yell and cuss at this provider and SW who was trying to inform patient of available bed.  Patient does not  appear to be responding to internal/external stimuli or delusional thoughts.  Patient denied  homicidal ideation, psychosis, and paranoia; but is now endorsing suicidal ideation.  Patient gave no specific plan.  Recommendations:  Inpatient psychiatric treatment.  SW will speak with EDP about possible transfer of patient to [facility] where patient can be seen face to face by psychiatrist.  Disposition:    Recommend psychiatric Inpatient admission when medically cleared.    Assunta Found, NP

## 2017-11-27 NOTE — ED Notes (Signed)
Pt alert and oriented pt denies any hi. Pt stated he does feel suicidal and noted with auditory hallucinations. Pt states he cant explain what the voices are saying. Pt contract to safety. Will continue to monitor.

## 2017-11-27 NOTE — ED Notes (Signed)
Pt asking for trazodone, informed that he does not have that medication ordered and that it is not available at this facility. Advised to turn tv off in order to help him get to sleep.
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
## 2017-11-27 NOTE — ED Notes (Signed)
Pt has been demanding and showing aggression by pushing tray tables into the wall when he does not get his way or get what he wants. . Firm redirection and firm boundaries have helped. Currently taking a shower.

## 2017-11-28 ENCOUNTER — Encounter (HOSPITAL_COMMUNITY): Payer: Self-pay | Admitting: Emergency Medicine

## 2017-11-28 DIAGNOSIS — R739 Hyperglycemia, unspecified: Secondary | ICD-10-CM

## 2017-11-28 DIAGNOSIS — F1094 Alcohol use, unspecified with alcohol-induced mood disorder: Secondary | ICD-10-CM

## 2017-11-28 LAB — CBG MONITORING, ED
Glucose-Capillary: 171 mg/dL — ABNORMAL HIGH (ref 70–99)
Glucose-Capillary: 71 mg/dL (ref 70–99)

## 2017-11-28 MED ORDER — SULFAMETHOXAZOLE-TRIMETHOPRIM 400-80 MG PO TABS: 1.0000 | ORAL_TABLET | Freq: Two times a day (BID) | ORAL | 0 refills | Status: DC

## 2017-11-28 NOTE — ED Notes (Signed)
IVC has been rescinded, pt to be dc'd.

## 2017-11-28 NOTE — ED Notes (Signed)
Up to the bathroom, ambulatory w/o difficulty 

## 2017-11-28 NOTE — ED Notes (Signed)
Pt sleeping, easily aroused.  NAD.  Pt denies hi/vh, but reports continuing suicidal thoughts w/o a plan and that he is hearing voices.

## 2017-11-28 NOTE — ED Notes (Signed)
On the phone 

## 2017-11-28 NOTE — ED Notes (Signed)
Up in hall pacing, requesting additional medication for anxiety,  Dr. Sharma Covert aware

## 2017-11-28 NOTE — Discharge Instructions (Signed)
For your mental health needs, you are advised to follow up with [facility].  New and returning patients are seen at their walk-in clinic.  Walk-in hours are Monday - Friday from 8:00 am - 3:00 pm.  Walk-in patients are seen on a first come, first served basis.  Try to arrive as early as possible for he best chance of being seen the same day: ° °     [facility] °     [address] °     [place], [place] [address] °     [phone] °

## 2017-11-28 NOTE — ED Notes (Addendum)
Pt reports increased anxiety, will repeat CIWA for medication

## 2017-11-28 NOTE — ED Notes (Signed)
Up to the bathroom 

## 2017-11-28 NOTE — BH Assessment (Signed)
[facility] Assessment Progress Note  Per Juanetta Beets, DO, this pt does not require psychiatric hospitalization at this time.  Pt presents under IVC initiated by EDP Alona Bene, MD, which Dr Sharma Covert has rescinded.  Pt is to be discharged from [facility] with recommendation to follow up with [facility].  This has been included in pt's discharge instructions.  Pt's nurse, Wille Celeste, has been notified.  Doylene Canning, MA Triage Specialist [phone]

## 2017-11-28 NOTE — ED Notes (Addendum)
Dr Sharma Covert and Jacki Cones NP into see.  Pt reports that he came to the hospital because he was "try to kill myself...plan to hang self...drink alcohol...."  Pt reports that he has a history of previous attempts.."cut arms.Social worker wreck...."  Pt reports that he is from [place] where he was living with his family, and came here to stay at a sobor living facility.  Pt reports that he was "kicked out" of the facility for drinking and trying to commit suicide.  Pt was admitted to [facility] 2 weeks ago and reports that he signed his self out and was to follow up at [facility] yesterday.  Pt reports his mother has a hx of suicide attempt. Pt denies HI/VH but reports that he hears voices that tell him that he "..is worthless...no good...kill myself..."

## 2017-11-28 NOTE — ED Notes (Addendum)
Snack given(crackers/juice)

## 2017-11-28 NOTE — Consult Note (Addendum)
[facility] Psych ED Discharge  11/28/2017 11:18 AM Drayden Lukas  MRN:  [id number] Principal Problem: MDD (major depressive disorder), recurrent episode, severe [facility]) Discharge Diagnoses:  Patient Active Problem List   Diagnosis Date Noted  . Hyperglycemia [R73.9]   . MDD (major depressive disorder), recurrent episode, severe (HCC) [F33.2] 11/25/2017  . Alcohol abuse [F10.10]   . Noncompliance with medications [Z91.14]   . 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 Alcohol-induced mood disorder (HCC) [F10.94]   . Suicidal ideation [R45.851]   . Cough [R05] 11/18/2017  . Essential hypertension [I10] 11/18/2017  . Psychiatric diagnosis [F99] 11/18/2017  . Type 2 diabetes mellitus with hyperlipidemia (HCC) [E11.69, E78.5] 11/18/2017  . Renal insufficiency [N28.9]   . Diabetic ketoacidosis without coma associated with type 2 diabetes mellitus (HCC) [E11.10] 11/16/2017    Subjective: Pt was seen and chart reviewed with treatment team and Dr Sharma Covert. Pt has had 2 ED visits in the [facility] system in the past 6 months. He was recently admitted to [facility] from 10-25 to 11-24-2017 and signed himself out. He was instructed to follow up at [facility] but instead went to [facility] and stayed there for over 61 hours at which point he was transferred to [facility] under IVC. Pt is from the [place] area and was in [place] at a sober living house and was kicked out due to drinking and a self-report of trying to commit suicide. Pt has had multiple admissions to hospitals in the state of [place] and appears to be malingering. Pt will state he is suicidal in order to gain admission to the hospital. Pt is manipulative and demanding and will curse and yell at staff if he does not get what he wants. He shoved a bedside table against the wall and told the nurse "fuck you" when she would not give him a snack.  Pt has a high degree of secondary gain by seeking admission to the hospital due to  being homeless and needing shelter and food. Pt will be instructed to follow up at [facility] for medication management and therapy. He was also given these same instructions upon discharge from [facility] but did not make his appointment because he checked in to [facility] instead. Pt is psychiatrically clear for discharge.   Total Time spent with patient: 30 minutes  Past Psychiatric History: As above  Past Medical History:  Past Medical History:  Diagnosis Date  . Alcohol abuse   . Depression   . Diabetes mellitus without complication (HCC)    non-compliant  . Substance abuse [facility])    Past Surgical History:  Procedure Laterality Date  . CLAVICLE SURGERY     Family History:  Family History  Problem Relation Age of Onset  . Fibromyalgia Mother    Family Psychiatric  History: Mother- Depression Social History:  Social History   Substance and Sexual Activity  Alcohol Use Yes  . Frequency: Never    Social History   Substance and Sexual Activity  Drug Use Yes  . Frequency: 175.0 times per week  . Types: "Crack" cocaine, Morphine   Social History   Socioeconomic History  . Marital status: Single    Spouse name: unknown  . Number of children: 0  . Years of education: 0  . Highest education level: 9th grade  Occupational History  . Not on file  Social Needs  . Financial resource strain: Not hard at all  . Food insecurity:    Worry: Patient refused  Inability: Patient refused  . Transportation needs:    Medical: Patient refused    Non-medical: Patient refused  Tobacco Use  . Smoking status: Current Every Day Smoker  . Smokeless tobacco: Never Used  Substance and Sexual Activity  . Alcohol use: Yes    Frequency: Never  . Drug use: Yes    Frequency: 175.0 times per week    Types: "Crack" cocaine, Morphine  . Sexual activity: Not Currently    Birth control/protection: None  Lifestyle  . Physical activity:    Days per week: Patient refused    Minutes per session: Patient  refused  . Stress: Not on file  Relationships  . Social connections:    Talks on phone: Not on file    Gets together: Not on file    Attends religious service: Not on file    Active member of club or organization: Not on file    Attends meetings of clubs or organizations: Not on file    Relationship status: Not on file  Other Topics Concern  . Not on file  Social History Narrative  . Not on file    Has this patient used any form of tobacco in the last 30 days? (Cigarettes, Smokeless Tobacco, Cigars, and/or Pipes) Prescription not provided because: Pt denies smoking  Current Medications: Current Facility-Administered Medications  Medication Dose Route Frequency Provider Last Rate Last Dose  . alum & mag hydroxide-simeth (MAALOX/MYLANTA) 200-200-20 MG/5ML suspension 30 mL  30 mL Oral Q6H PRN Jacalyn Lefevre, MD      . gabapentin (NEURONTIN) capsule 300 mg  300 mg Oral TID Jacalyn Lefevre, MD   300 mg at 11/28/17 0936  . guaiFENesin (ROBITUSSIN) 100 MG/5ML solution 100 mg  5 mL Oral Q4H PRN Laveda Abbe, NP   100 mg at 11/27/17 1629  . hydrOXYzine (ATARAX/VISTARIL) tablet 25 mg  25 mg Oral Q6H PRN Jacalyn Lefevre, MD   25 mg at 11/28/17 1000  . ibuprofen (ADVIL,MOTRIN) tablet 600 mg  600 mg Oral Q6H PRN Laveda Abbe, NP   600 mg at 11/27/17 1503  . insulin aspart (novoLOG) injection 0-15 Units  0-15 Units Subcutaneous TID WC Bethann Berkshire, MD   3 Units at 11/27/17 1737  . insulin glargine (LANTUS) injection 39 Units  39 Units Subcutaneous QHS Jacalyn Lefevre, MD   39 Units at 11/27/17 2134  . loperamide (IMODIUM) capsule 4 mg  4 mg Oral PRN Melene Plan, DO   4 mg at 11/25/17 1133  . LORazepam (ATIVAN) injection 0-4 mg  0-4 mg Intravenous Q12H Jacalyn Lefevre, MD       Or  . LORazepam (ATIVAN) tablet 0-4 mg  0-4 mg Oral Q12H Jacalyn Lefevre, MD   1 mg at 11/28/17 0846  . metoprolol succinate (TOPROL-XL) 24 hr tablet 25 mg  25 mg Oral Daily Jacalyn Lefevre, MD   25 mg at  11/28/17 1610  . nicotine (NICODERM CQ - dosed in mg/24 hours) patch 21 mg  21 mg Transdermal Daily Jacalyn Lefevre, MD      . ondansetron [facility]) tablet 4 mg  4 mg Oral Q8H PRN Jacalyn Lefevre, MD      . pravastatin (PRAVACHOL) tablet 40 mg  40 mg Oral QHS Jacalyn Lefevre, MD   40 mg at 11/27/17 2131  . sulfamethoxazole-trimethoprim (BACTRIM,SEPTRA) 400-80 MG per tablet 1 tablet  1 tablet Oral Q12H Laveda Abbe, NP   1 tablet at 11/28/17 [phone]  . thiamine (VITAMIN B-1) tablet 100 mg  100 mg Oral Daily Jacalyn Lefevre, MD   100 mg at 11/28/17 1610   Or  . thiamine (B-1) injection 100 mg  100 mg Intravenous Daily Jacalyn Lefevre, MD      . traZODone (DESYREL) tablet 150 mg  150 mg Oral QHS Jacalyn Lefevre, MD   150 mg at 11/27/17 2127   Current Outpatient Medications  Medication Sig Dispense Refill  . diazepam (VALIUM) 5 MG tablet Take 1 tablet (5 mg total) by mouth 3 (three) times daily. 10 tablet 0  . gabapentin (NEURONTIN) 300 MG capsule Take 1 capsule (300 mg total) by mouth 3 (three) times daily. (Patient taking differently: Take 300 mg by mouth 2 (two) times daily. ) 90 capsule 0  . hydrOXYzine (ATARAX/VISTARIL) 25 MG tablet Take 1 tablet (25 mg total) by mouth every 6 (six) hours as needed for itching or anxiety. (Patient taking differently: Take 25 mg by mouth 2 (two) times daily. ) 30 tablet 0  . ibuprofen (ADVIL,MOTRIN) 800 MG tablet Take 800 mg by mouth every 6 (six) hours as needed for mild pain.    Marland Kitchen insulin aspart (NOVOLOG) 100 UNIT/ML injection Inject 5-10 Units into the skin 3 (three) times daily before meals.     . insulin glargine (LANTUS) 100 UNIT/ML injection Inject 39 Units into the skin at bedtime.    . metoprolol succinate (TOPROL-XL) 25 MG 24 hr tablet Take 1 tablet (25 mg total) by mouth daily. 30 tablet 0  . pravastatin (PRAVACHOL) 40 MG tablet Take 40 mg by mouth at bedtime.   0  . sulfamethoxazole-trimethoprim (BACTRIM,SEPTRA) 400-80 MG tablet Take 1 tablet by  mouth every 12 (twelve) hours. 6 tablet 0  . traMADol (ULTRAM) 50 MG tablet Take 100 mg by mouth 2 (two) times daily.    . traZODone (DESYREL) 100 MG tablet Take 2 tablets (200 mg total) by mouth at bedtime and may repeat dose one time if needed. 60 tablet 0  . amoxicillin (AMOXIL) 250 MG/5ML suspension Take 10 mLs (500 mg total) by mouth 2 (two) times daily. 200 mL 0  . nicotine polacrilex (NICORETTE) 2 MG gum Take 1 each (2 mg total) by mouth as needed for smoking cessation. (Patient not taking: Reported on 11/27/2017) 100 tablet 0    Musculoskeletal: Strength & Muscle Tone: within normal limits Gait & Station: normal Patient leans: N/A  Psychiatric Specialty Exam: Physical Exam  Nursing note and vitals reviewed. Constitutional: He is oriented to person, place, and time. He appears well-developed and well-nourished.  HENT:  Head: Normocephalic and atraumatic.  Neck: Normal range of motion.  Respiratory: Effort normal.  Musculoskeletal: Normal range of motion.  Neurological: He is alert and oriented to person, place, and time.  Psychiatric: His speech is normal. Judgment and thought content normal. His mood appears anxious. He is agitated. Cognition and memory are normal.    Review of Systems  Psychiatric/Behavioral: The patient is nervous/anxious.   All other systems reviewed and are negative.   Blood pressure (!) 167/88, pulse 77, temperature 97.8 F (36.6 C), temperature source Oral, resp. rate 16, height 5\' 9"  (1.753 m), weight 81.6 kg, SpO2 96 %.Body mass index is 26.57 kg/m.  General Appearance: Casual  Eye Contact:  Good  Speech:  Clear and Coherent and Normal Rate  Volume:  Normal  Mood:  Anxious and Irritable  Affect:  Congruent  Thought Process:  Coherent, Goal Directed, Linear and Descriptions of Associations: Intact  Orientation:  Full (Time, Place, and Person)  Thought  Content:  Logical  Suicidal Thoughts:  No  Homicidal Thoughts:  No  Memory:  Immediate;    Good Recent;   Good Remote;   Fair  Judgement:  Fair  Insight:  Fair  Psychomotor Activity:  Normal  Concentration:  Concentration: Good and Attention Span: Good  Recall:  Good  Fund of Knowledge:  Good  Language:  Good  Akathisia:  No  Handed:  Right  AIMS (if indicated):   N/A  Assets:  Psychologist, counselling Resources/Insurance  ADL's:  Intact  Cognition:  WNL  Sleep:   N/A     Demographic Factors:  Male, Caucasian and Low socioeconomic status  Loss Factors: Financial problems/change in socioeconomic status  Historical Factors: Family history of mental illness or substance abuse  Risk Reduction Factors:   Sense of responsibility to family  Continued Clinical Symptoms:  Severe Anxiety and/or Agitation Depression:   Comorbid alcohol abuse/dependence Alcohol/Substance Abuse/Dependencies  Cognitive Features That Contribute To Risk:  Closed-mindedness    Suicide Risk:  Minimal: No identifiable suicidal ideation.  Patients presenting with no risk factors but with morbid ruminations; may be classified as minimal risk based on the severity of the depressive symptoms   Plan Of Care/Follow-up recommendations:  Activity:  as tolerated Diet:  Heart Healthy  Disposition: Alcohol Induced Mood Disorder:  Take all medications as prescribed by your outpatient provider when you go to [facility] for medication management. Keep all follow-up appointments as scheduled. You are instructed to follow up at [facility] for medication management and therapy.  Do not consume alcohol or use illegal drugs while on prescription medications. Report any adverse effects from your medications to your primary care provider promptly.  In the event of recurrent symptoms or worsening symptoms, call 911, a crisis hotline, or go to the nearest emergency department for evaluation.   Laveda Abbe, NP 11/28/2017, 11:18 AM   Patient seen face-to-face for psychiatric evaluation, chart  reviewed and case discussed with the physician extender and developed treatment plan. Reviewed the information documented and agree with the treatment plan.  Juanetta Beets, DO 11/28/17 12:47 PM

## 2017-11-28 NOTE — ED Notes (Addendum)
Sleeping on arrival to room, easiliy aroused, requesting additional medication for anxiety, reports back pain 9/10. Pt ate 100% of breakfast

## 2017-11-28 NOTE — ED Notes (Addendum)
CWS into talk w/ pt

## 2017-11-28 NOTE — ED Notes (Addendum)
PT ambulatory w/o difficulty to dc area w/ security, belongings returned.

## 2017-11-28 NOTE — ED Notes (Addendum)
Written dc instructions, prescription, IRC  And shelter information given and reviewed with pt.  Pt encouraged to take his medications as directed, check his blood sugars,  finish his antibiotics, and go to [place] today to arrainge for follow up.  Pt also encouraged to go to [facility] as soon as possible for further assistance.  Pt verbalized understanding.  Bus pass given. Pt declined sandwich/crackers reporting that he would get other food for lunch.

## 2017-12-26 ENCOUNTER — Encounter (HOSPITAL_COMMUNITY): Payer: Self-pay

## 2017-12-26 ENCOUNTER — Emergency Department (HOSPITAL_COMMUNITY)
Admission: EM | Admit: 2017-12-26 | Discharge: 2017-12-27 | Disposition: A | Discharge status: Home / Self Care | Payer: Self-pay | Attending: Emergency Medicine | Admitting: Emergency Medicine

## 2017-12-26 ENCOUNTER — Other Ambulatory Visit: Payer: Self-pay

## 2017-12-26 DIAGNOSIS — F17200 Nicotine dependence, unspecified, uncomplicated: Secondary | ICD-10-CM | POA: Insufficient documentation

## 2017-12-26 DIAGNOSIS — F1010 Alcohol abuse, uncomplicated: Secondary | ICD-10-CM

## 2017-12-26 DIAGNOSIS — E1165 Type 2 diabetes mellitus with hyperglycemia: Secondary | ICD-10-CM | POA: Insufficient documentation

## 2017-12-26 DIAGNOSIS — Z91199 Patient's noncompliance with other medical treatment and regimen due to unspecified reason: Secondary | ICD-10-CM

## 2017-12-26 DIAGNOSIS — F332 Major depressive disorder, recurrent severe without psychotic features: Secondary | ICD-10-CM | POA: Insufficient documentation

## 2017-12-26 DIAGNOSIS — Z79899 Other long term (current) drug therapy: Secondary | ICD-10-CM | POA: Insufficient documentation

## 2017-12-26 DIAGNOSIS — Z9119 Patient's noncompliance with other medical treatment and regimen: Secondary | ICD-10-CM | POA: Insufficient documentation

## 2017-12-26 DIAGNOSIS — R739 Hyperglycemia, unspecified: Secondary | ICD-10-CM

## 2017-12-26 DIAGNOSIS — F1014 Alcohol abuse with alcohol-induced mood disorder: Secondary | ICD-10-CM | POA: Insufficient documentation

## 2017-12-26 DIAGNOSIS — R45851 Suicidal ideations: Secondary | ICD-10-CM | POA: Insufficient documentation

## 2017-12-26 DIAGNOSIS — Z794 Long term (current) use of insulin: Secondary | ICD-10-CM | POA: Insufficient documentation

## 2017-12-26 LAB — CBC
HCT: 35.5 % — ABNORMAL LOW (ref 39.0–52.0)
HEMOGLOBIN: 11.1 g/dL — AB (ref 13.0–17.0)
MCH: 27.9 pg (ref 26.0–34.0)
MCHC: 31.3 g/dL (ref 30.0–36.0)
MCV: 89.2 fL (ref 80.0–100.0)
Platelets: 448 10*3/uL — ABNORMAL HIGH (ref 150–400)
RBC: 3.98 MIL/uL — AB (ref 4.22–5.81)
RDW: 15 % (ref 11.5–15.5)
WBC: 8.7 10*3/uL (ref 4.0–10.5)
nRBC: 0 % (ref 0.0–0.2)

## 2017-12-26 LAB — COMPREHENSIVE METABOLIC PANEL
ALBUMIN: 4 g/dL (ref 3.5–5.0)
ALK PHOS: 78 U/L (ref 38–126)
ALT: 18 U/L (ref 0–44)
AST: 17 U/L (ref 15–41)
Anion gap: 9 (ref 5–15)
BUN: 25 mg/dL — ABNORMAL HIGH (ref 6–20)
CALCIUM: 8.8 mg/dL — AB (ref 8.9–10.3)
CO2: 26 mmol/L (ref 22–32)
CREATININE: 1.07 mg/dL (ref 0.61–1.24)
Chloride: 99 mmol/L (ref 98–111)
GFR calc Af Amer: >60 mL/min (ref >60)
GFR calc non Af Amer: >60 mL/min (ref >60)
GLUCOSE: 480 mg/dL — AB (ref 70–99)
Potassium: 4.4 mmol/L (ref 3.5–5.1)
SODIUM: 134 mmol/L — AB (ref 135–145)
Total Bilirubin: 0.5 mg/dL (ref 0.3–1.2)
Total Protein: 7.1 g/dL (ref 6.5–8.1)

## 2017-12-26 LAB — ETHANOL: Alcohol, Ethyl (B): 179 mg/dL — ABNORMAL HIGH (ref <10)

## 2017-12-26 LAB — RAPID URINE DRUG SCREEN, HOSP PERFORMED
AMPHETAMINES: NOT DETECTED
Barbiturates: NOT DETECTED
Benzodiazepines: NOT DETECTED
Cocaine: NOT DETECTED
OPIATES: NOT DETECTED
TETRAHYDROCANNABINOL: NOT DETECTED

## 2017-12-26 LAB — CBG MONITORING, ED: Glucose-Capillary: 489 mg/dL — ABNORMAL HIGH (ref 70–99)

## 2017-12-26 LAB — ACETAMINOPHEN LEVEL: Acetaminophen (Tylenol), Serum: <10 ug/mL — ABNORMAL LOW (ref 10–30)

## 2017-12-26 LAB — SALICYLATE LEVEL: Salicylate Lvl: <7 mg/dL (ref 2.8–30.0)

## 2017-12-26 MED ORDER — SODIUM CHLORIDE 0.9 % IV BOLUS
1000.0000 mL | Freq: Once | INTRAVENOUS | Status: AC
  Administered 2017-12-26: 1000 mL via INTRAVENOUS

## 2017-12-26 MED ORDER — VITAMIN B-1 100 MG PO TABS
100.0000 mg | ORAL_TABLET | Freq: Every day | ORAL | Status: DC
  Administered 2017-12-27: 100 mg via ORAL
  Filled 2017-12-26: qty 1

## 2017-12-26 MED ORDER — LORAZEPAM 1 MG PO TABS: 0.0000 mg | ORAL_TABLET | Freq: Two times a day (BID) | ORAL | Status: DC

## 2017-12-26 MED ORDER — THIAMINE HCL 100 MG/ML IJ SOLN
100.0000 mg | Freq: Every day | INTRAMUSCULAR | Status: DC
  Administered 2017-12-26: 100 mg via INTRAVENOUS
  Filled 2017-12-26: qty 2

## 2017-12-26 MED ORDER — LORAZEPAM 2 MG/ML IJ SOLN
0.0000 mg | Freq: Four times a day (QID) | INTRAMUSCULAR | Status: DC
  Administered 2017-12-26: 4 mg via INTRAVENOUS
  Filled 2017-12-26: qty 2

## 2017-12-26 MED ORDER — LORAZEPAM 2 MG/ML IJ SOLN: 0.0000 mg | Freq: Two times a day (BID) | INTRAMUSCULAR | Status: DC

## 2017-12-26 MED ORDER — INSULIN ASPART 100 UNIT/ML ~~LOC~~ SOLN
10.0000 [IU] | Freq: Once | SUBCUTANEOUS | Status: AC
  Administered 2017-12-26: 10 [IU] via SUBCUTANEOUS
  Filled 2017-12-26: qty 1

## 2017-12-26 MED ORDER — LORAZEPAM 1 MG PO TABS
0.0000 mg | ORAL_TABLET | Freq: Four times a day (QID) | ORAL | Status: DC
  Administered 2017-12-27: 1 mg via ORAL
  Filled 2017-12-26: qty 1

## 2017-12-26 NOTE — ED Triage Notes (Signed)
Pt reports that he is suicidal by "the easiest way possible." He reports that he has had 4 24oz beers today as well as some oxy30s. Pt appears intoxicated. Pt presents with a luggage bag.

## 2017-12-26 NOTE — ED Notes (Signed)
Bed: WA27 Expected date:  Expected time:  Means of arrival:  Comments: [place]

## 2017-12-26 NOTE — ED Notes (Signed)
Pt has one white belongings bag and one large blue roller suitcase.

## 2017-12-27 ENCOUNTER — Other Ambulatory Visit: Payer: Self-pay

## 2017-12-27 DIAGNOSIS — Z7902 Long term (current) use of antithrombotics/antiplatelets: Secondary | ICD-10-CM | POA: Insufficient documentation

## 2017-12-27 DIAGNOSIS — F331 Major depressive disorder, recurrent, moderate: Secondary | ICD-10-CM | POA: Insufficient documentation

## 2017-12-27 DIAGNOSIS — R45851 Suicidal ideations: Secondary | ICD-10-CM | POA: Insufficient documentation

## 2017-12-27 DIAGNOSIS — F1110 Opioid abuse, uncomplicated: Secondary | ICD-10-CM | POA: Insufficient documentation

## 2017-12-27 DIAGNOSIS — F1010 Alcohol abuse, uncomplicated: Secondary | ICD-10-CM | POA: Insufficient documentation

## 2017-12-27 DIAGNOSIS — F17200 Nicotine dependence, unspecified, uncomplicated: Secondary | ICD-10-CM | POA: Insufficient documentation

## 2017-12-27 DIAGNOSIS — I10 Essential (primary) hypertension: Secondary | ICD-10-CM | POA: Insufficient documentation

## 2017-12-27 DIAGNOSIS — E119 Type 2 diabetes mellitus without complications: Secondary | ICD-10-CM | POA: Insufficient documentation

## 2017-12-27 DIAGNOSIS — Z794 Long term (current) use of insulin: Secondary | ICD-10-CM | POA: Insufficient documentation

## 2017-12-27 DIAGNOSIS — F1410 Cocaine abuse, uncomplicated: Secondary | ICD-10-CM | POA: Insufficient documentation

## 2017-12-27 LAB — CBC
HCT: 41.2 % (ref 39.0–52.0)
Hemoglobin: 12.6 g/dL — ABNORMAL LOW (ref 13.0–17.0)
MCH: 27.5 pg (ref 26.0–34.0)
MCHC: 30.6 g/dL (ref 30.0–36.0)
MCV: 90 fL (ref 80.0–100.0)
PLATELETS: 445 10*3/uL — AB (ref 150–400)
RBC: 4.58 MIL/uL (ref 4.22–5.81)
RDW: 15 % (ref 11.5–15.5)
WBC: 8.1 10*3/uL (ref 4.0–10.5)
nRBC: 0 % (ref 0.0–0.2)

## 2017-12-27 LAB — COMPREHENSIVE METABOLIC PANEL
ALT: 17 U/L (ref 0–44)
AST: 18 U/L (ref 15–41)
Albumin: 4.2 g/dL (ref 3.5–5.0)
Alkaline Phosphatase: 70 U/L (ref 38–126)
Anion gap: 9 (ref 5–15)
BUN: 18 mg/dL (ref 6–20)
CALCIUM: 9.2 mg/dL (ref 8.9–10.3)
CO2: 30 mmol/L (ref 22–32)
CREATININE: 0.69 mg/dL (ref 0.61–1.24)
Chloride: 98 mmol/L (ref 98–111)
GFR calc non Af Amer: >60 mL/min (ref >60)
Glucose, Bld: 148 mg/dL — ABNORMAL HIGH (ref 70–99)
Potassium: 3.9 mmol/L (ref 3.5–5.1)
Sodium: 137 mmol/L (ref 135–145)
Total Bilirubin: 0.5 mg/dL (ref 0.3–1.2)
Total Protein: 7.3 g/dL (ref 6.5–8.1)

## 2017-12-27 LAB — URINE DRUG SCREEN, QUALITATIVE (ARMC ONLY)
Amphetamines, Ur Screen: NOT DETECTED
Barbiturates, Ur Screen: NOT DETECTED
Benzodiazepine, Ur Scrn: POSITIVE — AB
Cannabinoid 50 Ng, Ur ~~LOC~~: NOT DETECTED
Cocaine Metabolite,Ur ~~LOC~~: NOT DETECTED
MDMA (Ecstasy)Ur Screen: NOT DETECTED
Methadone Scn, Ur: NOT DETECTED
Opiate, Ur Screen: NOT DETECTED
Phencyclidine (PCP) Ur S: NOT DETECTED
TRICYCLIC, UR SCREEN: NOT DETECTED

## 2017-12-27 LAB — CBG MONITORING, ED
Glucose-Capillary: 398 mg/dL — ABNORMAL HIGH (ref 70–99)
Glucose-Capillary: 126 mg/dL — ABNORMAL HIGH (ref 70–99)
Glucose-Capillary: 52 mg/dL — ABNORMAL LOW (ref 70–99)
Glucose-Capillary: 95 mg/dL (ref 70–99)

## 2017-12-27 LAB — SALICYLATE LEVEL

## 2017-12-27 LAB — ETHANOL: Alcohol, Ethyl (B): 127 mg/dL — ABNORMAL HIGH (ref <10)

## 2017-12-27 LAB — ACETAMINOPHEN LEVEL: Acetaminophen (Tylenol), Serum: <10 ug/mL — ABNORMAL LOW (ref 10–30)

## 2017-12-27 MED ORDER — TRAZODONE HCL 100 MG PO TABS: 200.0000 mg | ORAL_TABLET | Freq: Every evening | ORAL | Status: DC | PRN

## 2017-12-27 MED ORDER — METOPROLOL SUCCINATE ER 25 MG PO TB24
25.0000 mg | ORAL_TABLET | Freq: Every day | ORAL | Status: DC
  Administered 2017-12-27: 25 mg via ORAL
  Filled 2017-12-27: qty 1

## 2017-12-27 MED ORDER — GABAPENTIN 300 MG PO CAPS
300.0000 mg | ORAL_CAPSULE | Freq: Three times a day (TID) | ORAL | Status: DC
  Administered 2017-12-27: 300 mg via ORAL
  Filled 2017-12-27: qty 1

## 2017-12-27 MED ORDER — IBUPROFEN 200 MG PO TABS
600.0000 mg | ORAL_TABLET | Freq: Once | ORAL | Status: AC
  Administered 2017-12-27: 600 mg via ORAL
  Filled 2017-12-27: qty 3

## 2017-12-27 MED ORDER — INSULIN ASPART 100 UNIT/ML ~~LOC~~ SOLN: 0.0000 [IU] | Freq: Three times a day (TID) | SUBCUTANEOUS | Status: DC

## 2017-12-27 MED ORDER — PRAVASTATIN SODIUM 20 MG PO TABS
40.0000 mg | ORAL_TABLET | Freq: Every day | ORAL | Status: DC
  Administered 2017-12-27: 40 mg via ORAL
  Filled 2017-12-27: qty 2

## 2017-12-27 MED ORDER — INSULIN GLARGINE 100 UNIT/ML ~~LOC~~ SOLN
39.0000 [IU] | Freq: Every day | SUBCUTANEOUS | Status: DC
  Administered 2017-12-27: 39 [IU] via SUBCUTANEOUS
  Filled 2017-12-27: qty 0.39

## 2017-12-27 MED ORDER — NICOTINE POLACRILEX 2 MG MT GUM: 2.0000 mg | CHEWING_GUM | OROMUCOSAL | Status: DC | PRN

## 2017-12-27 MED ORDER — INSULIN ASPART 100 UNIT/ML ~~LOC~~ SOLN: 5.0000 [IU] | Freq: Three times a day (TID) | SUBCUTANEOUS | Status: DC

## 2017-12-27 MED ORDER — HYDROXYZINE HCL 25 MG PO TABS
25.0000 mg | ORAL_TABLET | Freq: Four times a day (QID) | ORAL | Status: DC | PRN
  Administered 2017-12-27: 25 mg via ORAL
  Filled 2017-12-27: qty 1

## 2017-12-27 MED ORDER — ALUM & MAG HYDROXIDE-SIMETH 200-200-20 MG/5ML PO SUSP: 30.0000 mL | Freq: Four times a day (QID) | ORAL | Status: DC | PRN

## 2017-12-27 NOTE — BH Assessment (Deleted)
Assessment Note  Connor Bailey is an 42 y.o. male.   Diagnosis: dx  Past Medical History:  Past Medical History:  Diagnosis Date  . Alcohol abuse   . Depression   . Diabetes mellitus without complication (HCC)    non-compliant  . Substance abuse (HCC)   . Type 1 diabetes mellitus (HCC)     Past Surgical History:  Procedure Laterality Date  . CLAVICLE SURGERY      Family History:  Family History  Problem Relation Age of Onset  . Fibromyalgia Mother     Social History:  reports that he has been smoking. He has never used smokeless tobacco. He reports that he drinks alcohol. He reports that he has current or past drug history. Drugs: "Crack" cocaine and Morphine. Frequency: 175.00 times per week.  Additional Social History:  Alcohol / Drug Use Pain Medications: See MAR Prescriptions:  See MAR Over the Counter: See MAR History of alcohol / drug use?: Yes Longest period of sobriety (when/how long): UTA Negative Consequences of Use: Financial Substance #1 Name of Substance 1: Alcohol.  1 - Age of First Use: UTA 1 - Amount (size/oz): Per chart, pt's BAL was 179 at 2101 on 12/26/2017. 1 - Frequency: UTA 1 - Duration: UTA 1 - Last Use / Amount: UTA  CIWA: CIWA-Ar BP: (!) 169/100 Pulse Rate: (!) 103 Nausea and Vomiting: no nausea and no vomiting Tactile Disturbances: none Tremor: not visible, but can be felt fingertip to fingertip Auditory Disturbances: not present Paroxysmal Sweats: no sweat visible Visual Disturbances: not present Anxiety: three Headache, Fullness in Head: very mild Agitation: two Orientation and Clouding of Sensorium: oriented and can do serial additions CIWA-Ar Total: 7 COWS:    Allergies: No Known Allergies  Home Medications:  (Not in a hospital admission)  OB/GYN Status:  No LMP for male patient.  General Assessment Data Assessment unable to be completed: Yes Reason for not completing assessment: Per Belenda CruiseKristin, RN pt was given 4 mg of  Ativan due to agitation. Clinician observed pt sleeping. Clinician to check back with pt. Location of Assessment: [facility] ED TTS Assessment: In system Is this a Tele or Face-to-Face Assessment?: Face-to-Face Is this an Initial Assessment or a Re-assessment for this encounter?: Initial Assessment Patient Accompanied by:: N/A Language Other than English: No Living Arrangements: Other (Comment)(Alone) What gender do you identify as?: Male Marital status: Divorced Living Arrangements: Alone Can pt return to current living arrangement?: (UTA) Admission Status: Voluntary Is patient capable of signing voluntary admission?: Yes Referral Source: Self/Family/Friend Insurance type: Self-pay.      Crisis Care Plan Living Arrangements: Alone Legal Guardian: Other:(Self. ) Name of Psychiatrist: NA Name of Therapist: NA  Education Status Is patient currently in school?: (UTA)  Risk to self with the past 6 months Suicidal Ideation: Yes-Currently Present Has patient been a risk to self within the past 6 months prior to admission? : Yes(Per chart. ) Suicidal Intent: Yes-Currently Present Has patient had any suicidal intent within the past 6 months prior to admission? : Yes Is patient at risk for suicide?: Yes Suicidal Plan?: Yes-Currently Present Has patient had any suicidal plan within the past 6 months prior to admission? : Yes(Per chart. ) Specify Current Suicidal Plan: Pt reported, "to jump in front of a car."  Access to Means: Yes Specify Access to Suicidal Means: Pt has access traffic.  What has been your use of drugs/alcohol within the last 12 months?: BAL: 179 at 2110 on 12/26/2017. Previous Attempts/Gestures: Yes How  many times?: 1 Other Self Harm Risks: UTA Triggers for Past Attempts: Unknown Intentional Self Injurious Behavior: (UTA) Family Suicide History: Unable to assess Recent stressful life event(s): Other (Comment)(Pt denies, stressors.) Persecutory voices/beliefs?:  No Depression: Yes Depression Symptoms: (sadness.) Substance abuse history and/or treatment for substance abuse?: No Suicide prevention information given to non-admitted patients: Not applicable  Risk to Others within the past 6 months Homicidal Ideation: No(Pt denies. ) Does patient have any lifetime risk of violence toward others beyond the six months prior to admission? : (UTA) Thoughts of Harm to Others: No Comment - Thoughts of Harm to Others: NA Current Homicidal Intent: No Current Homicidal Plan: No Access to Homicidal Means: (UTA) Identified Victim: NA History of harm to others?: (UTA) Assessment of Violence: (UTA) Violent Behavior Description: UTA Does patient have access to weapons?: (UTA) Criminal Charges Pending?: No(Pt denies. ) Does patient have a court date: No(Pt denies. ) Is patient on probation?: No(Pt denies. )  Psychosis Hallucinations: None noted(Pt denies. ) Delusions: None noted(Pt denies. )  Mental Status Report Appearance/Hygiene: Disheveled, In scrubs Eye Contact: Poor Motor Activity: Unremarkable Speech: Slow, Slurred Level of Consciousness: Sleeping Mood: Preoccupied Affect: Flat Anxiety Level: None Thought Processes: Circumstantial Judgement: Impaired Orientation: Person Obsessive Compulsive Thoughts/Behaviors: Unable to Assess  Cognitive Functioning Concentration: Poor Memory: Recent Impaired, Remote Impaired Is patient IDD: No Insight: Poor Impulse Control: Poor Appetite: (UTA) Sleep: Unable to Assess Vegetative Symptoms: Unable to Assess  ADLScreening [facility] Assessment Services) Patient's cognitive ability adequate to safely complete daily activities?: Yes Patient able to express need for assistance with ADLs?: Yes Independently performs ADLs?: Yes (appropriate for developmental age)  Prior Inpatient Therapy Prior Inpatient Therapy: (UTA) Prior Therapy Dates: UTA Prior Therapy Facilty/Provider(s): UTA Reason for Treatment:  UTA  Prior Outpatient Therapy Prior Outpatient Therapy: No Does patient have an ACCT team?: No Does patient have Intensive In-House Services?  : No Does patient have [facility] services? : No Does patient have P4CC services?: No  ADL Screening (condition at time of admission) Patient's cognitive ability adequate to safely complete daily activities?: Yes Is the patient deaf or have difficulty hearing?: No Does the patient have difficulty seeing, even when wearing glasses/contacts?: (UTA) Does the patient have difficulty concentrating, remembering, or making decisions?: Yes Patient able to express need for assistance with ADLs?: Yes Does the patient have difficulty dressing or bathing?: No Independently performs ADLs?: Yes (appropriate for developmental age) Does the patient have difficulty walking or climbing stairs?: (UTA) Weakness of Legs: (UTA) Weakness of Arms/Hands: (UTA)  Home Assistive Devices/Equipment Home Assistive Devices/Equipment: (UTA)    Abuse/Neglect Assessment (Assessment to be complete while patient is alone) Abuse/Neglect Assessment Can Be Completed: Yes Physical Abuse: Denies(Pt denies. ) Verbal Abuse: Denies(Pt denies. ) Sexual Abuse: Denies(Pt denies. ) Exploitation of patient/patient's resources: Denies(Pt denies. ) Self-Neglect: Denies(Pt denies. )     Advance Directives (For Healthcare) Does Patient Have a Medical Advance Directive?: No Would patient like information on creating a medical advance directive?: No - Patient declined          Disposition:  Disposition Initial Assessment Completed for this Encounter: Yes  On Site Evaluation by:   Reviewed with Physician:    Redmond Pulling 12/27/2017 6:14 AM   Redmond Pulling, MS, LPC, [facility] Triage Specialist [phone]

## 2017-12-27 NOTE — BHH Counselor (Signed)
Clinician attempted to assess pt. Pt continued sleeping, unable to rouse. Per Belenda CruiseKristin, RN was given 25 mg Hydroxyzine. Clinician to check back.   Redmond Pullingreylese D Avonda Toso, MS, [facility], [facility] Triage Specialist [phone]

## 2017-12-27 NOTE — ED Notes (Signed)
Pt sleeping at present, not interactive with staff.  A&O x 3, no distress noted.  Presents with SI, by easiest way possible pt stated.  Denies HI or AVH.  Monitoring for safety, Q 15 min checks in effect.

## 2017-12-27 NOTE — BHH Suicide Risk Assessment (Signed)
Suicide Risk Assessment  Discharge Assessment   [facility]   Principal Problem: Alcohol abuse with alcohol-induced mood disorder [facility](HCC) Discharge Diagnoses: Principal Problem:   Alcohol abuse with alcohol-induced mood disorder (HCC)   Total Time spent with patient: 45 minutes  Musculoskeletal: Strength & Muscle Tone: within normal limits Gait & Station: normal Patient leans: N/A  Psychiatric Specialty Exam:   Blood pressure (!) 169/87, pulse 92, temperature 98.2 F (36.8 C), temperature source Oral, resp. rate 19, SpO2 97 %.There is no height or weight on file to calculate BMI.  General Appearance: Casual  Eye Contact::  Good  Speech:  Normal Rate409  Volume:  Normal  Mood:  Anxious  Affect:  Congruent  Thought Process:  Coherent and Descriptions of Associations: Intact  Orientation:  Full (Time, Place, and Person)  Thought Content:  WDL and Logical  Suicidal Thoughts:  No  Homicidal Thoughts:  No  Memory:  Immediate;   Good Recent;   Good Remote;   Good  Judgement:  Fair  Insight:  Fair  Psychomotor Activity:  Normal  Concentration:  Good  Recall:  Good  Fund of Knowledge:Fair  Language: Good  Akathisia:  No  Handed:  Right  AIMS (if indicated):     Assets:  Leisure Time Physical Health Resilience  Sleep:     Cognition: WNL  ADL's:  Intact   Mental Status Per Nursing Assessment::   On Admission:   42 yo male who presented to the ED with alcohol intoxication with suicidal ideations.  Today on assessment, he is clear and coherent with no suicidal/homicidal ideations, hallucinations, and withdrawal symptoms.  Peer support consult placed, stable for discharge.  Demographic Factors:  Male and Caucasian  Loss Factors: NA  Historical Factors: NA  Risk Reduction Factors:   Sense of responsibility to family, Positive social support and Positive therapeutic relationship  Continued Clinical Symptoms:  Anxiety, mild  Cognitive  Features That Contribute To Risk:  None    Suicide Risk:  Minimal: No identifiable suicidal ideation.  Patients presenting with no risk factors but with morbid ruminations; may be classified as minimal risk based on the severity of the depressive symptoms    Plan Of Care/Follow-up recommendations:  Activity:  as tolerated Diet:  heart healthy diet  , , NP 12/27/2017, 11:01 AM

## 2017-12-27 NOTE — BH Assessment (Addendum)
Assessment Note  Connor Bailey is an 42 y.o. male, who [facility] During the assessment continued to dose off, when redirected to re-engage, pt would provide very little information. Pt then stop responding to questions asked. Pt was a poor historian during the assessment. Clinician asked the pt, "what brought you to the hospital?" Pt reported, "alcohol and to harm myself." Pt reported, having a plan to "jump in front of a car." Pt denies, current stressors. Pt denies. HI, AVH, self-injurious behaviors and access to weapons.  Clinician was unable to assess the following: family history of abuse, history of violence, DSS involvement, mood, previous inpatient admissions, contract for safety.  Pt presents sleeping, disheveled in scrubs with slow, slurred speech. Pt's eye contact was poor (pt's eye were closed during the assessment.) Pt's affect was flat. Pt's thought process was circumstantial. Pt's judgement was impaired. Pt was oriented x1. Pt's concentration, insight and impulse control are poor.   Diagnosis: Major Depressive Disorder, recurrent, severe without psychosis.                      Alcohol use Disorder, severe.  Past Medical History:  Past Medical History:  Diagnosis Date  . Alcohol abuse   . Depression   . Diabetes mellitus without complication (HCC)    non-compliant  . Substance abuse (HCC)   . Type 1 diabetes mellitus (HCC)     Past Surgical History:  Procedure Laterality Date  . CLAVICLE SURGERY      Family History:  Family History  Problem Relation Age of Onset  . Fibromyalgia Mother     Social History:  reports that he has been smoking. He has never used smokeless tobacco. He reports that he drinks alcohol. He reports that he has current or past drug history. Drugs: "Crack" cocaine and Morphine. Frequency: 175.00 times per week.  Additional Social History:  Alcohol / Drug Use Pain Medications: See MAR Prescriptions:  See  MAR Over the Counter: See MAR History of alcohol / drug use?: Yes Longest period of sobriety (when/how long): UTA Negative Consequences of Use: Financial Substance #1 Name of Substance 1: Alcohol.  1 - Age of First Use: UTA 1 - Amount (size/oz): Per chart, pt's BAL was 179 at 2101 on 12/26/2017. 1 - Frequency: UTA 1 - Duration: UTA 1 - Last Use / Amount: UTA  CIWA: CIWA-Ar BP: (!) 172/99 Pulse Rate: 93 Nausea and Vomiting: no nausea and no vomiting Tactile Disturbances: none Tremor: not visible, but can be felt fingertip to fingertip Auditory Disturbances: not present Paroxysmal Sweats: no sweat visible Visual Disturbances: not present Anxiety: three Headache, Fullness in Head: very mild Agitation: two Orientation and Clouding of Sensorium: oriented and can do serial additions CIWA-Ar Total: 7 COWS:    Allergies: No Known Allergies  Home Medications:  (Not in a hospital admission)  OB/GYN Status:  No LMP for male patient.  General Assessment Data Assessment unable to be completed: Yes Reason for not completing assessment: Per Belenda CruiseKristin, RN pt was given 4 mg of Ativan due to agitation. Clinician observed pt sleeping. Clinician to check back with pt. Location of Assessment: [facility] TTS Assessment: In system Is this a Tele or Face-to-Face Assessment?: Face-to-Face Is this an Initial Assessment or a Re-assessment for this encounter?: Initial Assessment Patient Accompanied by:: N/A Language Other than English: No Living Arrangements: Other (Comment)(Alone) What gender do you identify as?: Male Marital status: Divorced Living Arrangements: Alone Can pt return to  current living arrangement?: (UTA) Admission Status: Voluntary Is patient capable of signing voluntary admission?: Yes Referral Source: Self/Family/Friend Insurance type: Self-pay.      Crisis Care Plan Living Arrangements: Alone Legal Guardian: Other:(Self. ) Name of Psychiatrist: NA Name of Therapist:  NA  Education Status Is patient currently in school?: (UTA)  Risk to self with the past 6 months Suicidal Ideation: Yes-Currently Present Has patient been a risk to self within the past 6 months prior to admission? : Yes(Per chart. ) Suicidal Intent: Yes-Currently Present Has patient had any suicidal intent within the past 6 months prior to admission? : Yes Is patient at risk for suicide?: Yes Suicidal Plan?: Yes-Currently Present Has patient had any suicidal plan within the past 6 months prior to admission? : Yes(Per chart. ) Specify Current Suicidal Plan: Pt reported, "to jump in front of a car."  Access to Means: Yes Specify Access to Suicidal Means: Pt has access traffic.  What has been your use of drugs/alcohol within the last 12 months?: BAL: 179 at 2110 on 12/26/2017. Previous Attempts/Gestures: Yes How many times?: 1 Other Self Harm Risks: UTA Triggers for Past Attempts: Unknown Intentional Self Injurious Behavior: None(Pt denies.) Family Suicide History: Unable to assess Recent stressful life event(s): Other (Comment)(Pt denies, stressors.) Persecutory voices/beliefs?: No Depression: Yes Depression Symptoms: Feeling worthless/self pity, Loss of interest in usual pleasures, Fatigue Substance abuse history and/or treatment for substance abuse?: No Suicide prevention information given to non-admitted patients: Not applicable  Risk to Others within the past 6 months Homicidal Ideation: No(Pt denies. ) Does patient have any lifetime risk of violence toward others beyond the six months prior to admission? : (UTA) Thoughts of Harm to Others: No Comment - Thoughts of Harm to Others: NA Current Homicidal Intent: No Current Homicidal Plan: No Access to Homicidal Means: (UTA) Identified Victim: NA History of harm to others?: (UTA) Assessment of Violence: (UTA) Violent Behavior Description: UTA Does patient have access to weapons?: (UTA) Criminal Charges Pending?: No(Pt  denies. ) Does patient have a court date: No(Pt denies. ) Is patient on probation?: No(Pt denies. )  Psychosis Hallucinations: None noted(Pt denies. ) Delusions: None noted(Pt denies. )  Mental Status Report Appearance/Hygiene: Disheveled, In scrubs Eye Contact: Poor Motor Activity: Unremarkable Speech: Slow, Slurred Level of Consciousness: Sleeping Mood: (UTA) Affect: Flat Anxiety Level: None Thought Processes: Circumstantial Judgement: Impaired Orientation: Person Obsessive Compulsive Thoughts/Behaviors: Unable to Assess  Cognitive Functioning Concentration: Poor Memory: Recent Impaired, Remote Impaired Is patient IDD: No Insight: Poor Impulse Control: Poor Appetite: (UTA) Sleep: Unable to Assess Vegetative Symptoms: Unable to Assess  ADLScreening [facility] Assessment Services) Patient's cognitive ability adequate to safely complete daily activities?: Yes Patient able to express need for assistance with ADLs?: Yes Independently performs ADLs?: Yes (appropriate for developmental age)  Prior Inpatient Therapy Prior Inpatient Therapy: (UTA) Prior Therapy Dates: UTA Prior Therapy Facilty/Provider(s): UTA Reason for Treatment: UTA  Prior Outpatient Therapy Prior Outpatient Therapy: No Does patient have an ACCT team?: No Does patient have Intensive In-House Services?  : No Does patient have [facility] services? : No Does patient have P4CC services?: No  ADL Screening (condition at time of admission) Patient's cognitive ability adequate to safely complete daily activities?: Yes Is the patient deaf or have difficulty hearing?: No Does the patient have difficulty seeing, even when wearing glasses/contacts?: (UTA) Does the patient have difficulty concentrating, remembering, or making decisions?: Yes Patient able to express need for assistance with ADLs?: Yes Does the patient have difficulty dressing or bathing?:  No Independently performs ADLs?: Yes (appropriate for  developmental age) Does the patient have difficulty walking or climbing stairs?: (UTA) Weakness of Legs: (UTA) Weakness of Arms/Hands: (UTA)  Home Assistive Devices/Equipment Home Assistive Devices/Equipment: (UTA)    Abuse/Neglect Assessment (Assessment to be complete while patient is alone) Abuse/Neglect Assessment Can Be Completed: Yes Physical Abuse: Denies(Pt denies. ) Verbal Abuse: Denies(Pt denies. ) Sexual Abuse: Denies(Pt denies. ) Exploitation of patient/patient's resources: Denies(Pt denies. ) Self-Neglect: Denies(Pt denies. )     Advance Directives (For Healthcare) Does Patient Have a Medical Advance Directive?: No Would patient like information on creating a medical advance directive?: No - Patient declined          Disposition: Nira Conn, NP recommends overnight observation for safety/stabilization and re-evaluation. Disposition discussed with Rob, PA and Joanie Coddington, Charity fundraiser.   Disposition Initial Assessment Completed for this Encounter: Yes  On Site Evaluation by: Redmond Pulling, MS, LPC, CRC. Reviewed with Physician: Cleone Slim, PA and Nira Conn, NP.    Redmond Pulling 12/27/2017 6:30 AM   Redmond Pulling, MS, [facility], [facility] Triage Specialist [phone]

## 2017-12-27 NOTE — ED Notes (Signed)
Pt has large blue suitcase. [employer] Officer checking out suitcase at this time.

## 2017-12-27 NOTE — ED Notes (Signed)
Pt discharged home. Discharged instructions read to pt who verbalized understanding. All belongings returned to pt who signed for same. Denies SI/HI, is not delusional and not responding to internal stimuli. Escorted pt to the ED exit.    

## 2017-12-27 NOTE — BHH Counselor (Signed)
Per Belenda CruiseKristin, RN pt was given 4 mg of Ativan due to agitation. Clinician observed pt sleeping. Clinician to check back with pt.   Redmond Pullingreylese D Synda Bagent, MS, [facility], [facility] Triage Specialist [phone]

## 2017-12-27 NOTE — ED Triage Notes (Signed)
Pt comes escorted by [employer] Officers for IVC. Per [employer] officer pt got off a train and stated that he didn't want to live anymore and that he had a plan to hang himself.  Pt states depression and that he has the plan to hang himself. Pt states 6 24oz beers drank today. Pt denies any drug use today but did use opioids yesterday.  Pt states SI denies HI.  Pt calm and cooperative in triage.

## 2017-12-27 NOTE — ED Notes (Signed)
Patient wanded by security and escorted to room 35.

## 2017-12-27 NOTE — ED Notes (Signed)
Patient states he drinks 4 24 ounce icehouse beers/day and his last drink was today before he came to hospital.

## 2017-12-27 NOTE — ED Notes (Signed)
Pt A&Ox4, NAD. CBG = 52: Pt not symptomatic. 240 ml OJ given with peanut butter and crackers.

## 2017-12-27 NOTE — ED Notes (Signed)
patient belongings in dayroom big blue suitcase and one hospital personal belonging bag

## 2017-12-27 NOTE — ED Provider Notes (Signed)
[facility]-EMERGENCY DEPT Provider Note   CSN: [id number] Arrival date & time: 12/26/17  2034     History   Chief Complaint Chief Complaint  Patient presents with  . Suicidal    HPI Connor Bailey is a 42 y.o. male.  The history is provided by the patient. The history is limited by the condition of the patient (intoxicated).  Pt was seen at 2105. Per pt: States he is here because he is "suicidal by the easiest way possible." Pt has been drinking etoh today, did not take his insulin. Denies SA, no HI, no AVH. The symptoms have been associated with no other complaints. The patient has a significant history of similar symptoms previously, recently being evaluated for this complaint and multiple prior evals for same.      Past Medical History:  Diagnosis Date  . Alcohol abuse   . Depression   . Diabetes mellitus without complication (HCC)    non-compliant  . Substance abuse (HCC)   . Type 1 diabetes mellitus [facility])     Patient Active Problem List   Diagnosis Date Noted  . Hyperglycemia   . MDD (major depressive disorder), recurrent episode, severe (HCC) 11/25/2017  . Alcohol abuse   . Noncompliance with medications   . MDD (major depressive disorder), recurrent, severe, with psychosis (HCC) 11/21/2017  . Alcohol-induced mood disorder (HCC)   . Suicidal ideation   . Cough 11/18/2017  . Essential hypertension 11/18/2017  . Psychiatric diagnosis 11/18/2017  . Type 2 diabetes mellitus with hyperlipidemia (HCC) 11/18/2017  . Renal insufficiency   . Diabetic ketoacidosis without coma associated with type 2 diabetes mellitus (HCC) 11/16/2017    Past Surgical History:  Procedure Laterality Date  . CLAVICLE SURGERY          Home Medications    Prior to Admission medications   Medication Sig Start Date End Date Taking? Authorizing Provider  amoxicillin (AMOXIL) 250 MG/5ML suspension Take 10 mLs (500 mg total) by mouth 2 (two) times daily. 11/25/17    Dione Booze, MD  diazepam (VALIUM) 5 MG tablet Take 1 tablet (5 mg total) by mouth 3 (three) times daily. 11/24/17   Oneta Rack, NP  gabapentin (NEURONTIN) 300 MG capsule Take 1 capsule (300 mg total) by mouth 3 (three) times daily. Patient taking differently: Take 300 mg by mouth 2 (two) times daily.  11/24/17   Oneta Rack, NP  hydrOXYzine (ATARAX/VISTARIL) 25 MG tablet Take 1 tablet (25 mg total) by mouth every 6 (six) hours as needed for itching or anxiety. Patient taking differently: Take 25 mg by mouth 2 (two) times daily.  11/24/17   Oneta Rack, NP  ibuprofen (ADVIL,MOTRIN) 800 MG tablet Take 800 mg by mouth every 6 (six) hours as needed for mild pain.    [provider]  insulin aspart (NOVOLOG) 100 UNIT/ML injection Inject 5-10 Units into the skin 3 (three) times daily before meals.     [provider]  insulin glargine (LANTUS) 100 UNIT/ML injection Inject 39 Units into the skin at bedtime.    [provider]  metoprolol succinate (TOPROL-XL) 25 MG 24 hr tablet Take 1 tablet (25 mg total) by mouth daily. 11/25/17   Oneta Rack, NP  nicotine polacrilex (NICORETTE) 2 MG gum Take 1 each (2 mg total) by mouth as needed for smoking cessation. Patient not taking: Reported on 11/27/2017 11/24/17   Oneta Rack, NP  pravastatin (PRAVACHOL) 40 MG tablet Take 40 mg  by mouth at bedtime.  11/14/17   [provider]  sulfamethoxazole-trimethoprim (BACTRIM,SEPTRA) 400-80 MG tablet Take 1 tablet by mouth 2 (two) times daily. 11/28/17   Laveda AbbeParks, Laurie Britton, NP  traMADol (ULTRAM) 50 MG tablet Take 100 mg by mouth 2 (two) times daily.    [provider]  traZODone (DESYREL) 100 MG tablet Take 2 tablets (200 mg total) by mouth at bedtime and may repeat dose one time if needed. 11/24/17   Oneta RackLewis, Tanika N, NP    Family History Family History  Problem Relation Age of Onset  . Fibromyalgia Mother     Social History Social History    Tobacco Use  . Smoking status: Current Every Day Smoker  . Smokeless tobacco: Never Used  Substance Use Topics  . Alcohol use: Yes    Frequency: Never  . Drug use: Yes    Frequency: 175.0 times per week    Types: "Crack" cocaine, Morphine     Allergies   Patient has no known allergies.   Review of Systems Review of Systems  Unable to perform ROS: Psychiatric disorder     Physical Exam Updated Vital Signs BP (!) 157/98 (BP Location: Left Arm)   Pulse 100   Temp 99 F (37.2 C) (Oral)   Resp 20   SpO2 94%   Physical Exam 2110: Physical examination:  Nursing notes reviewed; Vital signs and O2 SAT reviewed;  Constitutional: Well developed, Well nourished, Well hydrated, In no acute distress; Head:  Normocephalic, atraumatic; Eyes: EOMI, PERRL, No scleral icterus; ENMT: Mouth and pharynx normal, Mucous membranes moist; Neck: Supple, Full range of motion; Cardiovascular: Regular rate and rhythm; Respiratory: Breath sounds clear, No wheezes.  Speaking full sentences with ease, Normal respiratory effort/excursion; Chest: No deformity, Movement normal; Abdomen: Nondistended; Extremities: No deformity.; Neuro: AA&Ox3, Major CN grossly intact.  Speech clear. No gross focal motor deficits in extremities. Climbs on and off stretcher easily by himself. Gait steady.; Skin: Color normal, Warm, Dry.; Psych:  Endorses SI.     ED Treatments / Results  Labs (all labs ordered are listed, but only abnormal results are displayed)   EKG None  Radiology   Procedures Procedures (including critical care time)  Medications Ordered in ED Medications  LORazepam (ATIVAN) injection 0-4 mg (4 mg Intravenous Given 12/26/17 2246)    Or  LORazepam (ATIVAN) tablet 0-4 mg ( Oral See Alternative 12/26/17 2246)  LORazepam (ATIVAN) injection 0-4 mg (has no administration in time range)    Or  LORazepam (ATIVAN) tablet 0-4 mg (has no administration in time range)  thiamine (VITAMIN B-1) tablet  100 mg ( Oral See Alternative 12/26/17 2251)    Or  thiamine (B-1) injection 100 mg (100 mg Intravenous Given 12/26/17 2251)  sodium chloride 0.9 % bolus 1,000 mL (1,000 mLs Intravenous New Bag/Given 12/26/17 2243)  insulin aspart (novoLOG) injection 10 Units (10 Units Subcutaneous Given 12/26/17 2244)     Initial Impression / Assessment and Plan / ED Course  I have reviewed the triage vital signs and the nursing notes.  Pertinent labs & imaging results that were available during my care of the patient were reviewed by me and considered in my medical decision making (see chart for details).  MDM Reviewed: previous chart, nursing note and vitals Reviewed previous: labs Interpretation: labs   Results for orders placed or performed during the hospital encounter of 12/26/17  Comprehensive metabolic panel  Result Value Ref Range   Sodium 134 (L) 135 - 145 mmol/L  Potassium 4.4 3.5 - 5.1 mmol/L   Chloride 99 98 - 111 mmol/L   CO2 26 22 - 32 mmol/L   Glucose, Bld 480 (H) 70 - 99 mg/dL   BUN 25 (H) 6 - 20 mg/dL   Creatinine, Ser 1.61 0.61 - 1.24 mg/dL   Calcium 8.8 (L) 8.9 - 10.3 mg/dL   Total Protein 7.1 6.5 - 8.1 g/dL   Albumin 4.0 3.5 - 5.0 g/dL   AST 17 15 - 41 U/L   ALT 18 0 - 44 U/L   Alkaline Phosphatase 78 38 - 126 U/L   Total Bilirubin 0.5 0.3 - 1.2 mg/dL   GFR calc non Af Amer >60 >60 mL/min   GFR calc Af Amer >60 >60 mL/min   Anion gap 9 5 - 15  Ethanol  Result Value Ref Range   Alcohol, Ethyl (B) 179 (H) <10 mg/dL  Salicylate level  Result Value Ref Range   Salicylate Lvl <7.0 2.8 - 30.0 mg/dL  Acetaminophen level  Result Value Ref Range   Acetaminophen (Tylenol), Serum <10 (L) 10 - 30 ug/mL  cbc  Result Value Ref Range   WBC 8.7 4.0 - 10.5 K/uL   RBC 3.98 (L) 4.22 - 5.81 MIL/uL   Hemoglobin 11.1 (L) 13.0 - 17.0 g/dL   HCT 09.6 (L) 04.5 - 40.9 %   MCV 89.2 80.0 - 100.0 fL   MCH 27.9 26.0 - 34.0 pg   MCHC 31.3 30.0 - 36.0 g/dL   RDW 81.1 91.4 - 78.2 %    Platelets 448 (H) 150 - 400 K/uL   nRBC 0.0 0.0 - 0.2 %  Rapid urine drug screen (hospital performed)  Result Value Ref Range   Opiates NONE DETECTED NONE DETECTED   Cocaine NONE DETECTED NONE DETECTED   Benzodiazepines NONE DETECTED NONE DETECTED   Amphetamines NONE DETECTED NONE DETECTED   Tetrahydrocannabinol NONE DETECTED NONE DETECTED   Barbiturates NONE DETECTED NONE DETECTED  CBG monitoring, ED  Result Value Ref Range   Glucose-Capillary 489 (H) 70 - 99 mg/dL   Comment 1 Notify RN     0005:  SQ insulin and IVF NS bolus given with CBG trending downward. AG normal. No indication for medical admission at this time. Will have TTS evaluate. Holding orders written.    Final Clinical Impressions(s) / ED Diagnoses   Final diagnoses:  None    ED Discharge Orders    None       Samuel Jester, DO 12/27/17 0008

## 2017-12-28 ENCOUNTER — Emergency Department
Admission: EM | Admit: 2017-12-28 | Discharge: 2017-12-28 | Disposition: A | Discharge status: Home / Self Care | Payer: Self-pay | Attending: Emergency Medicine | Admitting: Emergency Medicine

## 2017-12-28 DIAGNOSIS — F1010 Alcohol abuse, uncomplicated: Secondary | ICD-10-CM

## 2017-12-28 DIAGNOSIS — F331 Major depressive disorder, recurrent, moderate: Secondary | ICD-10-CM

## 2017-12-28 LAB — GLUCOSE, CAPILLARY
GLUCOSE-CAPILLARY: 103 mg/dL — AB (ref 70–99)
GLUCOSE-CAPILLARY: 65 mg/dL — AB (ref 70–99)
Glucose-Capillary: 115 mg/dL — ABNORMAL HIGH (ref 70–99)

## 2017-12-28 MED ORDER — VITAMIN B-1 100 MG PO TABS
100.0000 mg | ORAL_TABLET | Freq: Every day | ORAL | Status: DC
  Administered 2017-12-28: 100 mg via ORAL
  Filled 2017-12-28: qty 1

## 2017-12-28 MED ORDER — LORAZEPAM 2 MG PO TABS: 0.0000 mg | ORAL_TABLET | Freq: Four times a day (QID) | ORAL | Status: DC

## 2017-12-28 MED ORDER — GABAPENTIN 300 MG PO CAPS
300.0000 mg | ORAL_CAPSULE | Freq: Two times a day (BID) | ORAL | Status: DC
  Administered 2017-12-28: 300 mg via ORAL
  Filled 2017-12-28 (×2): qty 1

## 2017-12-28 MED ORDER — INSULIN GLARGINE 100 UNIT/ML ~~LOC~~ SOLN
39.0000 [IU] | Freq: Every day | SUBCUTANEOUS | Status: DC
  Administered 2017-12-28: 39 [IU] via SUBCUTANEOUS
  Filled 2017-12-28 (×2): qty 0.39

## 2017-12-28 MED ORDER — METOPROLOL SUCCINATE ER 50 MG PO TB24
25.0000 mg | ORAL_TABLET | Freq: Every day | ORAL | Status: DC
  Administered 2017-12-28: 25 mg via ORAL
  Filled 2017-12-28: qty 1

## 2017-12-28 MED ORDER — GABAPENTIN 600 MG PO TABS
ORAL_TABLET | ORAL | Status: AC
  Administered 2017-12-28: 300 mg
  Filled 2017-12-28: qty 1

## 2017-12-28 MED ORDER — PRAVASTATIN SODIUM 40 MG PO TABS
40.0000 mg | ORAL_TABLET | Freq: Every day | ORAL | Status: DC
  Administered 2017-12-28: 40 mg via ORAL
  Filled 2017-12-28 (×2): qty 1

## 2017-12-28 MED ORDER — INSULIN ASPART 100 UNIT/ML ~~LOC~~ SOLN: 5.0000 [IU] | Freq: Three times a day (TID) | SUBCUTANEOUS | Status: DC

## 2017-12-28 MED ORDER — IBUPROFEN 600 MG PO TABS
600.0000 mg | ORAL_TABLET | Freq: Four times a day (QID) | ORAL | Status: DC | PRN
  Administered 2017-12-28: 600 mg via ORAL
  Filled 2017-12-28: qty 1
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
## 2017-12-28 NOTE — ED Provider Notes (Signed)
[facility] Emergency Department Provider Note   ____________________________________________   First MD Initiated Contact with Patient 12/28/17 0018     (approximate)  I have reviewed the triage vital signs and the nursing notes.   HISTORY  Chief Complaint Psychiatric Evaluation    HPI Connor Bailey is a 42 y.o. male brought to the ED by police under IVC for suicidal ideation.  Patient has a history of alcohol abuse, substance abuse, type I diabetic who got off the train from [facility] and found officer to tell him that he was suicidal.  Initially patient states he had a plan to hang himself but tells myself and the nurse that he wrapped a cord around his neck.  Does endorse daily alcohol use with prior history of DTs.  Voices no medical complaints currently.  Endorses SI without plan.  Denies HI/AH/VH.   Past Medical History:  Diagnosis Date  . Alcohol abuse   . Depression   . Diabetes mellitus without complication (HCC)    non-compliant  . Substance abuse (HCC)   . Type 1 diabetes mellitus [facility])     Patient Active Problem List   Diagnosis Date Noted  . Hyperglycemia   . Noncompliance with medications   . Alcohol abuse with alcohol-induced mood disorder (HCC)   . Suicidal ideation   . Essential hypertension 11/18/2017  . Type 2 diabetes mellitus with hyperlipidemia (HCC) 11/18/2017  . Renal insufficiency   . Diabetic ketoacidosis without coma associated with type 2 diabetes mellitus (HCC) 11/16/2017    Past Surgical History:  Procedure Laterality Date  . CLAVICLE SURGERY      Prior to Admission medications   Medication Sig Start Date End Date Taking? Authorizing Provider  gabapentin (NEURONTIN) 300 MG capsule Take 1 capsule (300 mg total) by mouth 3 (three) times daily. Patient taking differently: Take 300 mg by mouth 2 (two) times daily.  11/24/17   Oneta Rack, NP  hydrOXYzine (ATARAX/VISTARIL) 25 MG tablet Take 1 tablet (25 mg  total) by mouth every 6 (six) hours as needed for itching or anxiety. Patient taking differently: Take 25 mg by mouth 2 (two) times daily.  11/24/17   Oneta Rack, NP  ibuprofen (ADVIL,MOTRIN) 800 MG tablet Take 800 mg by mouth every 6 (six) hours as needed for mild pain.    [provider]  insulin aspart (NOVOLOG) 100 UNIT/ML injection Inject 5-10 Units into the skin 3 (three) times daily before meals.     [provider]  insulin glargine (LANTUS) 100 UNIT/ML injection Inject 39 Units into the skin at bedtime.    [provider]  metoprolol succinate (TOPROL-XL) 25 MG 24 hr tablet Take 1 tablet (25 mg total) by mouth daily. 11/25/17   Oneta Rack, NP  pravastatin (PRAVACHOL) 40 MG tablet Take 40 mg by mouth at bedtime.  11/14/17   [provider]  traMADol (ULTRAM) 50 MG tablet Take 100 mg by mouth 2 (two) times daily.    [provider]  traZODone (DESYREL) 100 MG tablet Take 2 tablets (200 mg total) by mouth at bedtime and may repeat dose one time if needed. 11/24/17   Oneta Rack, NP    Allergies Patient has no known allergies.  Family History  Problem Relation Age of Onset  . Fibromyalgia Mother     Social History Social History   Tobacco Use  . Smoking status: Current Every Day Smoker  . Smokeless tobacco: Never Used  Substance Use  Topics  . Alcohol use: Yes    Frequency: Never  . Drug use: Yes    Frequency: 175.0 times per week    Types: "Crack" cocaine, Morphine    Review of Systems  Constitutional: No fever/chills Eyes: No visual changes. ENT: No sore throat. Cardiovascular: Denies chest pain. Respiratory: Denies shortness of breath. Gastrointestinal: No abdominal pain.  No nausea, no vomiting.  No diarrhea.  No constipation. Genitourinary: Negative for dysuria. Musculoskeletal: Negative for back pain. Skin: Negative for rash. Neurological: Negative for headaches, focal weakness or  numbness. Psychiatric:Positive for depression with SI.  ____________________________________________   PHYSICAL EXAM:  VITAL SIGNS: ED Triage Vitals [12/27/17 2209]  Enc Vitals Group     BP (!) 160/84     Pulse Rate 87     Resp 18     Temp 97.7 F (36.5 C)     Temp src      SpO2 97 %     Weight 179 lb 14.3 oz (81.6 kg)     Height 5\' 9"  (1.753 m)     Head Circumference      Peak Flow      Pain Score 0     Pain Loc      Pain Edu?      Excl. in GC?     Constitutional: Alert and oriented.  Disheveled appearing and in no acute distress. Eyes: Conjunctivae are normal. PERRL. EOMI. Head: Atraumatic. Nose: No congestion/rhinnorhea. Mouth/Throat: Mucous membranes are moist.  Oropharynx non-erythematous. Neck: No stridor.   Cardiovascular: Normal rate, regular rhythm. Grossly normal heart sounds.  Good peripheral circulation. Respiratory: Normal respiratory effort.  No retractions. Lungs CTAB. Gastrointestinal: Soft and nontender. No distention. No abdominal bruits. No CVA tenderness. Musculoskeletal: No lower extremity tenderness nor edema.  No joint effusions. Neurologic:  Normal speech and language. No gross focal neurologic deficits are appreciated. No gait instability. Skin:  Skin is warm, dry and intact. No rash noted. Psychiatric: Mood and affect are normal. Speech and behavior are normal.  ____________________________________________   LABS (all labs ordered are listed, but only abnormal results are displayed)  Labs Reviewed  COMPREHENSIVE METABOLIC PANEL - Abnormal; Notable for the following components:      Result Value   Glucose, Bld 148 (*)    All other components within normal limits  ETHANOL - Abnormal; Notable for the following components:   Alcohol, Ethyl (B) 127 (*)    All other components within normal limits  ACETAMINOPHEN LEVEL - Abnormal; Notable for the following components:   Acetaminophen (Tylenol), Serum <10 (*)    All other components within  normal limits  CBC - Abnormal; Notable for the following components:   Hemoglobin 12.6 (*)    Platelets 445 (*)    All other components within normal limits  URINE DRUG SCREEN, QUALITATIVE ([facility] ONLY) - Abnormal; Notable for the following components:   Benzodiazepine, Ur Scrn POSITIVE (*)    All other components within normal limits  GLUCOSE, CAPILLARY - Abnormal; Notable for the following components:   Glucose-Capillary 103 (*)    All other components within normal limits  SALICYLATE LEVEL   ____________________________________________  EKG  None ____________________________________________  RADIOLOGY  ED MD interpretation: None  Official radiology report(s): No results found.  ____________________________________________   PROCEDURES  Procedure(s) performed: None  Procedures  Critical Care performed: No  ____________________________________________   INITIAL IMPRESSION / ASSESSMENT AND PLAN / ED COURSE  As part of my medical decision making, I reviewed the following data  within the electronic MEDICAL RECORD NUMBER Nursing notes reviewed and incorporated, Labs reviewed, Old chart reviewed, A consult was requested and obtained from this/these consultant(s) Psychiatry and Notes from prior ED visits   42 year old male with a history of depression, alcohol abuse and type 1 diabetes who presents under IVC for suicidal ideation.  I personally reviewed patient's records and see that he had a [facility] for similar complaints.  He was not hospitalized at that visit.  Given patient's alcohol abuse, will place him on CIWA protocol.  I have reviewed patient's laboratory urinalysis results.  He is currently medically cleared for psychiatric evaluation and ultimate disposition.  [facility].  Clinical Course as of Dec 29 727  Wynelle LinkSun Dec 28, 2017  0421 [facility] Dr[facility] Orpah Clintonollin who has  rescinded his IVC.  No medication recommendations at this time.  He did recommend social work consult for placement options which I will do.   [JS]    Clinical Course User Index [JS] Irean HongSung, Antonetta Clanton J, MD     ____________________________________________   FINAL CLINICAL IMPRESSION(S) / ED DIAGNOSES  Final diagnoses:  Alcohol abuse  Moderate episode of recurrent major depressive disorder [facility](HCC)     ED Discharge Orders    None       Note:  This document was prepared using Dragon voice recognition software and may include unintentional dictation errors.    Irean HongSung, Jannice Beitzel J, MD 12/28/17 [phone]

## 2017-12-28 NOTE — Progress Notes (Signed)
LCSW received a consult for patient. He is attached to [facility] [place] and due to his relapse he is not allowed to return to his residence for 30 days. He has no friends or family and has no means of transportation.  LCSW spoke to ED charge for possible cab voucher to shelter  He requested a referral to [facility]- LCSW called the [place] location and left a detailed message-awaiting call back.  LCSW the possibility of out patient treatment [facility] and to go [facility]  Jurline Folger LCSW [phone]

## 2017-12-28 NOTE — ED Provider Notes (Signed)
-----------------------------------------   9:12 AM on 12/28/2017 -----------------------------------------  The social worker has seen the patient, they have arranged for the patient to go to [facility].  Provided information for [facility].  We will discharge from the emergency department at 11 AM at which time the patient will take a taxicab to the shelter.   Minna AntisPaduchowski, Laquida Cotrell, MD 12/28/17 [phone]

## 2017-12-28 NOTE — ED Notes (Signed)
[facility]

## 2017-12-28 NOTE — Clinical Social Work Note (Signed)
Clinical Social Work Assessment  Patient Details  Name: Connor Bailey MRN: [id number] Date of Birth: 02/04/1975  Date of referral:  12/28/17               Reason for consult:  Discharge Planning, [employer] Resources, Substance Use/ETOH Abuse, Housing Concerns/Homelessness                Permission sought to share information with:    Permission granted to share information::  Yes, Verbal Permission Granted  Name::        Agency::  [facility]- Detox  Relationship::     Contact Information:     Housing/Transportation Living arrangements for the past 2 months:  Homeless Source of Information:  Patient Patient Interpreter Needed:  None Criminal Activity/Legal Involvement Pertinent to Current Situation/Hospitalization:  No - Comment as needed Significant Relationships:  None Lives with:  Facility Resident([facility] evicted for 30 days) Do you feel safe going back to the place where you live?  Yes Need for family participation in patient care:  No (Coment)  Care giving concerns:  none   Office managerocial Worker assessment / plan: LCSW introduced myself to patient who reports he wants to go to detox. He reports he is attached to [facility] [place] and has remained in their program for 2 weeks and he relapsed. He stated he is not able to return for 30 days. He was in [place] for the last 2 weeks. He gave verbal consent to LCSW  To speak to [facility]- Called awaiting call back to see if they have beds. I spoke to him about [facility] and ED charge nurse may be able to send patient by cab to [facility]. Patient reports no family no friends and no where to go.  Employment status:  Unemployed Health and safety inspectornsurance information:  Other (Comment Required)(No insurance) PT Recommendations:    Information / Referral to community resources:  Residential Substance Abuse Treatment Options, Shelter, Outpatient Substance Abuse Treatment Options  Patient/Family's Response to care: I need detox     Patient/Family's Understanding  of and Emotional Response to Diagnosis, Current Treatment, and Prognosis:  I need detox  Emotional Assessment Appearance:  Appears stated age Attitude/Demeanor/Rapport:  Guarded, Avoidant Affect (typically observed):  Frustrated Orientation:  Oriented to Self, Oriented to Situation, Oriented to Place, Oriented to  Time Alcohol / Substance use:  Alcohol Use Psych involvement (Current and /or in the community):  No (Comment)  Discharge Needs  Concerns to be addressed:    Readmission within the last 30 days:  No Current discharge risk:  Homeless Barriers to Discharge:  Active Substance Use   Cheron SchaumannBandi, Negar Sieler M, LCSW 12/28/2017, 8:07 AM

## 2017-12-28 NOTE — ED Notes (Signed)
Patient states that he felt shakey, nurse gave him graham crackers and Orange juice due to blood sugar 65. Patient ate and states that he is now asymptomatic.

## 2017-12-28 NOTE — ED Notes (Signed)
Pt belongings:  Carney BernJean pants Sweat shirt Black T-shirt Necklace Ring watch bracelet $3.67 in pants pocket Lighter White shoes [place]

## 2017-12-28 NOTE — Progress Notes (Signed)
Received call back from Connor Bailey and his current situation was explained and admissions director of  [facility] agreed to take patient at 11am.  Met with patient and provided him with several community handouts [employer] Tourist information centre manager, [facility]- Out patient, Homeless shelters and [employer] he gave verbal consent to apply his information to [employer].  LCSW consulted ED charge and patient will be transported by cab at 11am. Consulted EDP and was informed of plan and completed Goals/section and [employer] [place] resources. Patient reports he is no longer suicidal.  Enis Slipper LCSW [phone]

## 2017-12-28 NOTE — Progress Notes (Signed)
Called and left message for [employer]- [facility] as patients face sheet has [place] address- he may qualify to access shelter and resources. Awaiting call back.  Delta Air LinesClaudine Loel Betancur LCSW [phone]

## 2017-12-28 NOTE — ED Notes (Signed)
Patient voices understanding of discharge instructions, and all belongings given back to Patient and cab transferring him to homeless shelter.

## 2017-12-28 NOTE — ED Notes (Signed)
Nurse obtained a taxi voucher for Patient to go to homeless shelter.

## 2018-02-13 ENCOUNTER — Inpatient Hospital Stay
Admit: 2018-02-13 | Discharge: 2018-02-14 | Disposition: A | Discharge status: Home / Self Care | Payer: Self-pay | Attending: Emergency Medicine

## 2018-02-13 DIAGNOSIS — F1020 Alcohol dependence, uncomplicated: Secondary | ICD-10-CM

## 2018-02-13 NOTE — ED Provider Notes (Signed)
43 year old diabetic male with a longstanding history of alcoholism presents after being [facility] [place] earlier today.  He reports that he was brought here to be evaluated and then [facility] prior to [facility].    No fever vomiting or diarrhea    The history is provided by the patient.   Alcohol Problem   Primary symptoms include: intoxication.  There areno confusion, no somnolence, no seizures and no delusions present at this time.  This is a chronic problem. The problem has not changed since onset.Suspected agents include alcohol. Pertinent negatives include no fever, no nausea and no vomiting. Associated medical issues include addiction treatment.        Past Medical History:   Diagnosis Date   ??? Diabetes (HCC)    ??? Other ill-defined conditions(799.89)     45 broken bones       Past Surgical History:   Procedure Laterality Date   ??? HX HEENT      eye   ??? HX ORTHOPAEDIC      left ankle, left foot, right knee, 4 fingers right hand, both sides clavical         History reviewed. No pertinent family history.    Social History     Socioeconomic History   ??? Marital status: SINGLE     Spouse name: Not on file   ??? Number of children: Not on file   ??? Years of education: Not on file   ??? Highest education level: Not on file   Occupational History   ??? Not on file   Social Needs   ??? Financial resource strain: Not on file   ??? Food insecurity:     Worry: Not on file     Inability: Not on file   ??? Transportation needs:     Medical: Not on file     Non-medical: Not on file   Tobacco Use   ??? Smoking status: Former Smoker     Last attempt to quit: 02/01/2010     Years since quitting: 8.0   Substance and Sexual Activity   ??? Alcohol use: No   ??? Drug use: No   ??? Sexual activity: Not on file   Lifestyle   ??? Physical activity:     Days per week: Not on file     Minutes per session: Not on file   ??? Stress: Not on file   Relationships   ??? Social connections:      Talks on phone: Not on file     Gets together: Not on file     Attends religious service: Not on file     Active member of club or organization: Not on file     Attends meetings of clubs or organizations: Not on file     Relationship status: Not on file   ??? Intimate partner violence:     Fear of current or ex partner: Not on file     Emotionally abused: Not on file     Physically abused: Not on file     Forced sexual activity: Not on file   Other Topics Concern   ??? Not on file   Social History Narrative   ??? Not on file         ALLERGIES: Patient has no known allergies.    Review of Systems   Constitutional: Negative for activity change, chills, diaphoresis and fever.  HENT: Negative for dental problem, hearing loss, nosebleeds, rhinorrhea and sore throat.    Eyes: Negative for pain, discharge, redness and visual disturbance.   Respiratory: Negative for cough, chest tightness and shortness of breath.    Cardiovascular: Negative for chest pain, palpitations and leg swelling.   Gastrointestinal: Negative for abdominal pain, constipation, diarrhea, nausea and vomiting.   Endocrine: Negative for cold intolerance, heat intolerance, polydipsia and polyuria.   Genitourinary: Negative for dysuria and flank pain.   Musculoskeletal: Negative for arthralgias, back pain, joint swelling, myalgias and neck pain.   Skin: Negative for pallor and rash.   Allergic/Immunologic: Negative for environmental allergies and food allergies.   Neurological: Negative for dizziness, tremors, seizures, light-headedness, numbness and headaches.   Hematological: Negative for adenopathy. Does not bruise/bleed easily.   Psychiatric/Behavioral: Negative for confusion and dysphoric mood. The patient is nervous/anxious. The patient is not hyperactive.    All other systems reviewed and are negative.      Vitals:    02/13/18 1815   BP: (!) 154/95   Pulse: (!) 111   Resp: 17   Temp: 98.3 ??F (36.8 ??C)   SpO2: 95%   Weight: 86.2 kg (190 lb)   Height: 5'  9" (1.753 m)            Physical Exam  Vitals signs and nursing note reviewed.   Constitutional:       General: He is in acute distress.      Appearance: Normal appearance. He is well-developed and normal weight.   HENT:      Head: Normocephalic and atraumatic.      Right Ear: External ear normal.      Left Ear: External ear normal.      Mouth/Throat:      Mouth: Mucous membranes are moist.      Pharynx: Oropharynx is clear. No oropharyngeal exudate.   Eyes:      General: No scleral icterus.     Conjunctiva/sclera: Conjunctivae normal.      Pupils: Pupils are equal, round, and reactive to light.   Neck:      Musculoskeletal: Normal range of motion and neck supple.      Thyroid: No thyromegaly.      Vascular: No JVD.   Cardiovascular:      Rate and Rhythm: Normal rate and regular rhythm.      Pulses: Normal pulses.      Heart sounds: Normal heart sounds. No murmur. No friction rub. No gallop.    Pulmonary:      Effort: Pulmonary effort is normal. No respiratory distress.      Breath sounds: Normal breath sounds. No wheezing.   Abdominal:      General: Bowel sounds are normal. There is no distension.      Palpations: Abdomen is soft.      Tenderness: There is no tenderness.   Musculoskeletal: Normal range of motion.         General: No tenderness or deformity.   Skin:     General: Skin is warm and dry.      Capillary Refill: Capillary refill takes less than 2 seconds.      Findings: No rash.   Neurological:      General: No focal deficit present.      Mental Status: He is alert and oriented to person, place, and time.      Cranial Nerves: No cranial nerve deficit.      Sensory: No sensory deficit.  Motor: No abnormal muscle tone.      Coordination: Coordination normal.   Psychiatric:         Mood and Affect: Mood normal.         Behavior: Behavior normal.         Thought Content: Thought content normal.         Judgment: Judgment normal.          MDM  Number of Diagnoses or Management Options  Alcoholism /alcohol  abuse (HCC): established and worsening  Hyperglycemia: established and worsening  Diagnosis management comments: Will check labs  Patient appears nontoxic currently    [facility] consulted for help with further care and eventual disposition    8:57 PM  [facility] evaluation completed.  They have completed the [facility] phone interview  Patient will have to be held overnight for available bed in the morning.  We will continue to work with his blood sugars       Amount and/or Complexity of Data Reviewed  Clinical lab tests: ordered and reviewed  Tests in the medicine section of CPT??: ordered and reviewed  Review and summarize past medical records: yes    Risk of Complications, Morbidity, and/or Mortality  Presenting problems: moderate  Diagnostic procedures: moderate  Management options: moderate  General comments: Elements of this note have been dictated via voice recognition software.  Text and phrases may be limited by the accuracy of the software.  The chart has been reviewed, but errors may still be present.      Patient Progress  Patient progress: improved         Procedures

## 2018-02-13 NOTE — ED Notes (Signed)
Patient observed to be resting in bed with even, non-labored respirations. No needs apparent at current.

## 2018-02-13 NOTE — ED Notes (Signed)
PM insulin given per MAR. IVF continue to infuse. Plan to hold patient overnight on NS gtt, discharge to go to [facility] in AM. Patient agreeable to the plan.

## 2018-02-13 NOTE — ED Notes (Signed)
Labs drawn and sent. Patient made aware of need for urine. Patient provided ginger ale per MD request. No further needs expressed or apparent at current, patient restful in bed awaiting further.

## 2018-02-13 NOTE — ED Notes (Signed)
Patient to require insulin, IVF therapy. RN EMTP to room to attempt to establish PIV. Labs already drawn and sent, patient with poor vasculature with admitted history of IV drug abuse.

## 2018-02-13 NOTE — ED Notes (Signed)
Pt states he is an alcoholic and wants to go to the [facility] center through the ER.

## 2018-02-13 NOTE — ED Notes (Signed)
[facility] representative called at this time.

## 2018-02-13 NOTE — ED Notes (Signed)
IV bolus complete, IV gtt initiated. Patient medicated for nausea, anxiety/withdrawal per PRN order. No further needs expressed or apparent at current.

## 2018-02-13 NOTE — ED Notes (Signed)
BGL continues to improve. Patient requests some phenergan for nausea control, states hx of withdrawals and may require ativan overnight. Spoke with ER MD, agreeable to plan. Placing PRN orders now. Patient provided meal tray with MD approval, no other needs expressed or apparent at current.

## 2018-02-13 NOTE — ED Notes (Signed)
IV bolus complete, IV gtt initiated. Patient medicated for nausea, anxiety/withdrawal per PRN order. No further needs expressed or apparent at current.

## 2018-02-13 NOTE — ED Notes (Signed)
Labs drawn and sent. Patient made aware of need for urine. Patient provided ginger ale per MD request. No further needs expressed or apparent at current, patient restful in bed awaiting further.

## 2018-02-13 NOTE — ED Notes (Signed)
Patient observed to be resting in bed with even, non-labored respirations. No needs apparent at current.

## 2018-02-13 NOTE — ED Triage Notes (Signed)
Pt states he is an alcoholic and wants to go to the [facility] through the ER.

## 2018-02-13 NOTE — ED Notes (Signed)
BGL continues to improve. Patient requests some phenergan for nausea control, states hx of withdrawals and may require ativan overnight. Spoke with ER MD, agreeable to plan. Placing PRN orders now. Patient provided meal tray with MD approval, no other needs expressed or apparent at current.

## 2018-02-13 NOTE — ED Notes (Signed)
PM insulin given per MAR. IVF continue to infuse. Plan to hold patient overnight on NS gtt, discharge to go to [facility] in AM. Patient agreeable to the plan.

## 2018-02-13 NOTE — ED Provider Notes (Addendum)
43 year old diabetic male with a longstanding history of alcoholism presents after being denied reinjury at [facility] [place] earlier today.  He reports that he was brought here to be evaluated and then placed at the [facility] for detox prior to being [facility].    No fever vomiting or diarrhea    The history is provided by the patient.   Alcohol Problem   Primary symptoms include: intoxication.  There areno confusion, no somnolence, no seizures and no delusions present at this time.  This is a chronic problem. The problem has not changed since onset.Suspected agents include alcohol. Pertinent negatives include no fever, no nausea and no vomiting. Associated medical issues include addiction treatment.        Past Medical History:   Diagnosis Date   ??? Diabetes (HCC)    ??? Other ill-defined conditions(799.89)     45 broken bones       Past Surgical History:   Procedure Laterality Date   ??? HX HEENT      eye   ??? HX ORTHOPAEDIC      left ankle, left foot, right knee, 4 fingers right hand, both sides clavical         History reviewed. No pertinent family history.    Social History     Socioeconomic History   ??? Marital status: SINGLE     Spouse name: Not on file   ??? Number of children: Not on file   ??? Years of education: Not on file   ??? Highest education level: Not on file   Occupational History   ??? Not on file   Social Needs   ??? Financial resource strain: Not on file   ??? Food insecurity:     Worry: Not on file     Inability: Not on file   ??? Transportation needs:     Medical: Not on file     Non-medical: Not on file   Tobacco Use   ??? Smoking status: Former Smoker     Last attempt to quit: 02/01/2010     Years since quitting: 8.0   Substance and Sexual Activity   ??? Alcohol use: No   ??? Drug use: No   ??? Sexual activity: Not on file   Lifestyle   ??? Physical activity:     Days per week: Not on file     Minutes per session: Not on file   ??? Stress: Not on file   Relationships   ??? Social connections:      Talks on phone: Not on file     Gets together: Not on file     Attends religious service: Not on file     Active member of club or organization: Not on file     Attends meetings of clubs or organizations: Not on file     Relationship status: Not on file   ??? Intimate partner violence:     Fear of current or ex partner: Not on file     Emotionally abused: Not on file     Physically abused: Not on file     Forced sexual activity: Not on file   Other Topics Concern   ??? Not on file   Social History Narrative   ??? Not on file         ALLERGIES: Patient has no known allergies.    Review of Systems   Constitutional: Negative for activity change, chills, diaphoresis and fever.  HENT: Negative for dental problem, hearing loss, nosebleeds, rhinorrhea and sore throat.    Eyes: Negative for pain, discharge, redness and visual disturbance.   Respiratory: Negative for cough, chest tightness and shortness of breath.    Cardiovascular: Negative for chest pain, palpitations and leg swelling.   Gastrointestinal: Negative for abdominal pain, constipation, diarrhea, nausea and vomiting.   Endocrine: Negative for cold intolerance, heat intolerance, polydipsia and polyuria.   Genitourinary: Negative for dysuria and flank pain.   Musculoskeletal: Negative for arthralgias, back pain, joint swelling, myalgias and neck pain.   Skin: Negative for pallor and rash.   Allergic/Immunologic: Negative for environmental allergies and food allergies.   Neurological: Negative for dizziness, tremors, seizures, light-headedness, numbness and headaches.   Hematological: Negative for adenopathy. Does not bruise/bleed easily.   Psychiatric/Behavioral: Negative for confusion and dysphoric mood. The patient is nervous/anxious. The patient is not hyperactive.    All other systems reviewed and are negative.      Vitals:    02/13/18 1815   BP: (!) 154/95   Pulse: (!) 111   Resp: 17   Temp: 98.3 ??F (36.8 ??C)   SpO2: 95%   Weight: 86.2 kg (190 lb)    Height: 5\' 9"  (1.753 m)            Physical Exam  Vitals signs and nursing note reviewed.   Constitutional:       General: He is in acute distress.      Appearance: Normal appearance. He is well-developed and normal weight.   HENT:      Head: Normocephalic and atraumatic.      Right Ear: External ear normal.      Left Ear: External ear normal.      Mouth/Throat:      Mouth: Mucous membranes are moist.      Pharynx: Oropharynx is clear. No oropharyngeal exudate.   Eyes:      General: No scleral icterus.     Conjunctiva/sclera: Conjunctivae normal.      Pupils: Pupils are equal, round, and reactive to light.   Neck:      Musculoskeletal: Normal range of motion and neck supple.      Thyroid: No thyromegaly.      Vascular: No JVD.   Cardiovascular:      Rate and Rhythm: Normal rate and regular rhythm.      Pulses: Normal pulses.      Heart sounds: Normal heart sounds. No murmur. No friction rub. No gallop.    Pulmonary:      Effort: Pulmonary effort is normal. No respiratory distress.      Breath sounds: Normal breath sounds. No wheezing.   Abdominal:      General: Bowel sounds are normal. There is no distension.      Palpations: Abdomen is soft.      Tenderness: There is no tenderness.   Musculoskeletal: Normal range of motion.         General: No tenderness or deformity.   Skin:     General: Skin is warm and dry.      Capillary Refill: Capillary refill takes less than 2 seconds.      Findings: No rash.   Neurological:      General: No focal deficit present.      Mental Status: He is alert and oriented to person, place, and time.      Cranial Nerves: No cranial nerve deficit.      Sensory: No sensory deficit.  Motor: No abnormal muscle tone.      Coordination: Coordination normal.   Psychiatric:         Mood and Affect: Mood normal.         Behavior: Behavior normal.         Thought Content: Thought content normal.         Judgment: Judgment normal.          MDM  Number of Diagnoses or Management Options   Alcoholism /alcohol abuse (HCC): established and worsening  Hyperglycemia: established and worsening  Diagnosis management comments: Will check labs  Patient appears nontoxic currently    Favor consulted for help with further care and eventual disposition    8:57 PM  [facility] evaluation completed.  They have completed the [facility] phone interview  Patient will have to be held overnight for available bed in the morning.  We will continue to work with his blood sugars       Amount and/or Complexity of Data Reviewed  Clinical lab tests: ordered and reviewed  Tests in the medicine section of CPT??: ordered and reviewed  Review and summarize past medical records: yes    Risk of Complications, Morbidity, and/or Mortality  Presenting problems: moderate  Diagnostic procedures: moderate  Management options: moderate  General comments: Elements of this note have been dictated via voice recognition software.  Text and phrases may be limited by the accuracy of the software.  The chart has been reviewed, but errors may still be present.      Patient Progress  Patient progress: improved         Procedures

## 2018-02-13 NOTE — ED Notes (Signed)
Patient to require insulin, IVF therapy. RN EMTP to room to attempt to establish PIV. Labs already drawn and sent, patient with poor vasculature with admitted history of IV drug abuse.

## 2018-02-13 NOTE — ED Notes (Signed)
[facility] representative called at this time.

## 2018-02-14 LAB — DRUG SCREEN, URINE
AMPHETAMINES: NEGATIVE
Amphetamine Screen, Urine: NEGATIVE
BARBITURATES: NEGATIVE
BENZODIAZEPINES: NEGATIVE
Barbiturate Screen, Urine: NEGATIVE
Benzodiazepine Screen, Urine: NEGATIVE
COCAINE: NEGATIVE
Cocaine Screen Urine: NEGATIVE
METHADONE: NEGATIVE
Methadone Screen, Urine: NEGATIVE
OPIATES: NEGATIVE
Opiate Screen, Urine: NEGATIVE
PCP Screen, Urine: NEGATIVE
PCP(PHENCYCLIDINE): NEGATIVE
THC (TH-CANNABINOL): NEGATIVE
THC Screen, Urine: NEGATIVE

## 2018-02-14 LAB — CBC WITH AUTOMATED DIFF
ABS. BASOPHILS: 0.1 10*3/uL (ref 0.0–0.2)
ABS. EOSINOPHILS: 0.1 10*3/uL (ref 0.0–0.8)
ABS. IMM. GRANS.: 0 10*3/uL (ref 0.0–0.5)
ABS. LYMPHOCYTES: 2.5 10*3/uL (ref 0.5–4.6)
ABS. MONOCYTES: 0.9 10*3/uL (ref 0.1–1.3)
ABS. NEUTROPHILS: 5.8 10*3/uL (ref 1.7–8.2)
ABSOLUTE NRBC: 0 10*3/uL (ref 0.0–0.2)
BASOPHILS: 1 % (ref 0.0–2.0)
EOSINOPHILS: 2 % (ref 0.5–7.8)
HCT: 39.6 % — ABNORMAL LOW (ref 41.1–50.3)
HGB: 13.1 g/dL — ABNORMAL LOW (ref 13.6–17.2)
IMMATURE GRANULOCYTES: 0 % (ref 0.0–5.0)
LYMPHOCYTES: 27 % (ref 13–44)
MCH: 27.4 PG (ref 26.1–32.9)
MCHC: 33.1 g/dL (ref 31.4–35.0)
MCV: 82.8 FL (ref 79.6–97.8)
MONOCYTES: 9 % (ref 4.0–12.0)
MPV: 9.6 FL (ref 9.4–12.3)
NEUTROPHILS: 61 % (ref 43–78)
PLATELET: 391 10*3/uL (ref 150–450)
RBC: 4.78 M/uL (ref 4.23–5.6)
RDW: 13.5 % (ref 11.9–14.6)
WBC: 9.5 10*3/uL (ref 4.3–11.1)

## 2018-02-14 LAB — METABOLIC PANEL, COMPREHENSIVE
A-G Ratio: 0.8 — ABNORMAL LOW (ref 1.2–3.5)
ALT (SGPT): 24 U/L (ref 12–65)
AST (SGOT): 36 U/L (ref 15–37)
Albumin: 3.5 g/dL (ref 3.5–5.0)
Alk. phosphatase: 114 U/L (ref 50–136)
Anion gap: 15 mmol/L (ref 7–16)
BUN: 20 MG/DL (ref 6–23)
Bilirubin, total: 0.4 MG/DL (ref 0.2–1.1)
CO2: 23 mmol/L (ref 21–32)
Calcium: 9.2 MG/DL (ref 8.3–10.4)
Chloride: 93 mmol/L — ABNORMAL LOW (ref 98–107)
Creatinine: 1.4 MG/DL (ref 0.8–1.5)
GFR est AA: >60 mL/min/{1.73_m2} (ref >60)
GFR est non-AA: 59 mL/min/{1.73_m2} — ABNORMAL LOW (ref >60)
Globulin: 4.5 g/dL — ABNORMAL HIGH (ref 2.3–3.5)
Glucose: 594 mg/dL — CR (ref 65–100)
Potassium: 4.9 mmol/L (ref 3.5–5.1)
Protein, total: 8 g/dL (ref 6.3–8.2)
Sodium: 131 mmol/L — ABNORMAL LOW (ref 136–145)

## 2018-02-14 LAB — LIPASE
Lipase: 38 U/L — ABNORMAL LOW (ref 73–393)
Lipase: 38 U/L — ABNORMAL LOW (ref 73–393)

## 2018-02-14 LAB — GLUCOSE, POC
Glucose (POC): 395 mg/dL — ABNORMAL HIGH (ref 65–100)
Glucose (POC): 305 mg/dL — ABNORMAL HIGH (ref 65–100)
Glucose (POC): 45 mg/dL — ABNORMAL LOW (ref 65–100)
Glucose (POC): 164 mg/dL — ABNORMAL HIGH (ref 65–100)
Glucose (POC): 198 mg/dL — ABNORMAL HIGH (ref 65–100)

## 2018-02-14 LAB — ETHYL ALCOHOL
ALCOHOL(ETHYL),SERUM: 116 MG/DL
Ethyl Alcohol: 116 MG/DL

## 2018-02-14 LAB — COMPREHENSIVE METABOLIC PANEL
ALT: 24 U/L (ref 12–65)
AST: 36 U/L (ref 15–37)
Albumin/Globulin Ratio: 0.8 — ABNORMAL LOW (ref 1.2–3.5)
Albumin: 3.5 g/dL (ref 3.5–5.0)
Alkaline Phosphatase: 114 U/L (ref 50–136)
Anion Gap: 15 mmol/L (ref 7–16)
BUN: 20 MG/DL (ref 6–23)
CO2: 23 mmol/L (ref 21–32)
Calcium: 9.2 MG/DL (ref 8.3–10.4)
Chloride: 93 mmol/L — ABNORMAL LOW (ref 98–107)
Creatinine: 1.4 MG/DL (ref 0.8–1.5)
EGFR IF NonAfrican American: 59 mL/min/{1.73_m2} — ABNORMAL LOW (ref >60)
GFR African American: >60 mL/min/{1.73_m2} (ref >60)
Globulin: 4.5 g/dL — ABNORMAL HIGH (ref 2.3–3.5)
Glucose: 594 mg/dL (ref 65–100)
Potassium: 4.9 mmol/L (ref 3.5–5.1)
Sodium: 131 mmol/L — ABNORMAL LOW (ref 136–145)
Total Bilirubin: 0.4 MG/DL (ref 0.2–1.1)
Total Protein: 8 g/dL (ref 6.3–8.2)

## 2018-02-14 LAB — CBC WITH AUTO DIFFERENTIAL
Basophils %: 1 % (ref 0.0–2.0)
Basophils Absolute: 0.1 10*3/uL (ref 0.0–0.2)
Eosinophils %: 2 % (ref 0.5–7.8)
Eosinophils Absolute: 0.1 10*3/uL (ref 0.0–0.8)
Granulocyte Absolute Count: 0 10*3/uL (ref 0.0–0.5)
Hematocrit: 39.6 % — ABNORMAL LOW (ref 41.1–50.3)
Hemoglobin: 13.1 g/dL — ABNORMAL LOW (ref 13.6–17.2)
Immature Granulocytes: 0 % (ref 0.0–5.0)
Lymphocytes %: 27 % (ref 13–44)
Lymphocytes Absolute: 2.5 10*3/uL (ref 0.5–4.6)
MCH: 27.4 PG (ref 26.1–32.9)
MCHC: 33.1 g/dL (ref 31.4–35.0)
MCV: 82.8 FL (ref 79.6–97.8)
MPV: 9.6 FL (ref 9.4–12.3)
Monocytes %: 9 % (ref 4.0–12.0)
Monocytes Absolute: 0.9 10*3/uL (ref 0.1–1.3)
NRBC Absolute: 0 10*3/uL (ref 0.0–0.2)
Neutrophils %: 61 % (ref 43–78)
Neutrophils Absolute: 5.8 10*3/uL (ref 1.7–8.2)
Platelets: 391 10*3/uL (ref 150–450)
RBC: 4.78 M/uL (ref 4.23–5.6)
RDW: 13.5 % (ref 11.9–14.6)
WBC: 9.5 10*3/uL (ref 4.3–11.1)

## 2018-02-14 LAB — POCT GLUCOSE
POC Glucose: 395 mg/dL — ABNORMAL HIGH (ref 65–100)
POC Glucose: 305 mg/dL — ABNORMAL HIGH (ref 65–100)
POC Glucose: 45 mg/dL — ABNORMAL LOW (ref 65–100)
POC Glucose: 164 mg/dL — ABNORMAL HIGH (ref 65–100)
POC Glucose: 198 mg/dL — ABNORMAL HIGH (ref 65–100)

## 2018-02-14 MED ORDER — INSULIN GLARGINE 100 UNIT/ML INJECTION
100 unit/mL | Freq: Every evening | SUBCUTANEOUS | Status: DC
Start: 2018-02-14 — End: 2018-02-14
  Administered 2018-02-14: 03:00:00 via SUBCUTANEOUS

## 2018-02-14 MED ORDER — PROMETHAZINE 25 MG TAB
25 mg | ORAL | Status: DC | PRN
Start: 2018-02-14 — End: 2018-02-14
  Administered 2018-02-14: 04:00:00 via ORAL

## 2018-02-14 MED ORDER — INSULIN REGULAR HUMAN 100 UNIT/ML INJECTION
100 unit/mL | INTRAMUSCULAR | Status: AC
Start: 2018-02-14 — End: 2018-02-13
  Administered 2018-02-14: 01:00:00 via INTRAVENOUS

## 2018-02-14 MED ORDER — LORAZEPAM 1 MG TAB
1 mg | ORAL | Status: DC | PRN
Start: 2018-02-14 — End: 2018-02-14
  Administered 2018-02-14: 04:00:00 via ORAL

## 2018-02-14 MED ORDER — SODIUM CHLORIDE 0.9% BOLUS IV
0.9 % | Freq: Once | INTRAVENOUS | Status: AC
Start: 2018-02-14 — End: 2018-02-13
  Administered 2018-02-14: 01:00:00 via INTRAVENOUS

## 2018-02-14 MED ORDER — INSULIN LISPRO 100 UNIT/ML INJECTION
100 unit/mL | Freq: Once | SUBCUTANEOUS | Status: AC
Start: 2018-02-14 — End: 2018-02-14
  Administered 2018-02-14: 13:00:00 via SUBCUTANEOUS

## 2018-02-14 MED ORDER — SODIUM CHLORIDE 0.9 % IV
INTRAVENOUS | Status: DC
Start: 2018-02-14 — End: 2018-02-14
  Administered 2018-02-14: 04:00:00 via INTRAVENOUS

## 2018-02-14 MED FILL — LORAZEPAM 1 MG TAB: 1 mg | ORAL | Qty: 1

## 2018-02-14 MED FILL — INSULIN LISPRO 100 UNIT/ML INJECTION: 100 unit/mL | SUBCUTANEOUS | Qty: 1

## 2018-02-14 MED FILL — INSULIN GLARGINE 100 UNIT/ML INJECTION: 100 unit/mL | SUBCUTANEOUS | Qty: 1

## 2018-02-14 MED FILL — INSULIN REGULAR HUMAN 100 UNIT/ML INJECTION: 100 unit/mL | INTRAMUSCULAR | Qty: 1

## 2018-02-14 MED FILL — PROMETHAZINE 25 MG TAB: 25 mg | ORAL | Qty: 1

## 2018-02-14 NOTE — ED Notes (Signed)
Pt c/o low blood sugar , poc glucose 45, given  juice and meal trey , provider notified

## 2018-02-14 NOTE — ED Notes (Signed)
Full report to [employer]. Care assumed by that RN at this time.  At current, patient restful in bed with no apparent needs.     Plan overnight:   1: PRN ativan, phenergan as needed  2: AM BGL (ACHS order)  3: Pass of in AM to day shift RN that patient has a room at [facility] center at 0900. Details on chart. Taxi will need to be provided- plan to DC at 0800 and arrange transport.

## 2018-02-14 NOTE — ED Notes (Signed)
Report received from [facility], pt is in bed resting

## 2018-02-14 NOTE — ED Notes (Signed)
Pt repeat glucose 162, pt resting in bed,with lights off , warm blanket given

## 2018-02-14 NOTE — ED Notes (Signed)
I have reviewed discharge instructions with the patient.  The patient verbalized understanding.    Patient left ED via Discharge Method: ambulatory to [facility] with [employer].    Opportunity for questions and clarification provided.       Patient given 0 scripts.         To continue your aftercare when you leave the hospital, you may receive an automated call from our care team to check in on how you are doing.  This is a free service and part of our promise to provide the best care and service to meet your aftercare needs." If you have questions, or wish to unsubscribe from this service please call [phone].  Thank you for Choosing our [facility].

## 2018-02-14 NOTE — ED Notes (Signed)
I have reviewed discharge instructions with the patient.  The patient verbalized understanding.    Patient left ED via Discharge Method: ambulatory to [facility] with [employer].    Opportunity for questions and clarification provided.       Patient given 0 scripts.         To continue your aftercare when you leave the hospital, you may receive an automated call from our care team to check in on how you are doing.  This is a free service and part of our promise to provide the best care and service to meet your aftercare needs.??? If you have questions, or wish to unsubscribe from this service please call [phone].  Thank you for Choosing our [place] [facility].

## 2018-02-14 NOTE — ED Notes (Signed)
Pt repeat glucose 162, pt resting in bed,with lights off , warm blanket given

## 2018-02-14 NOTE — ED Notes (Signed)
Report received from Jack Dorfner, pt is in bed resting

## 2018-02-14 NOTE — ED Notes (Signed)
Full report to RN Jack. Care assumed by that RN at this time.  At current, patient restful in bed with no apparent needs.     Plan overnight:   1: PRN ativan, phenergan as needed  2: AM BGL ([facility]HS order)  3: Pass of in AM to day shift RN that patient has a room at [facility] at 0900. Details on chart. Taxi will need to be provided- plan to DC at 0800 and arrange transport.

## 2018-02-14 NOTE — ED Notes (Signed)
Pt c/o low blood sugar , poc glucose 45, given  juice and meal trey , provider notified

## 2018-09-29 DEATH — deceased

## 2020-05-01 IMAGING — DX DG CHEST 1V PORT
1 series · 1 of 1 positions shown · non-contrast
Comparison: Radiographs November 20, 2017.

CLINICAL DATA: Hyperglycemia.

EXAM:
PORTABLE CHEST 1 VIEW

[chest ap]
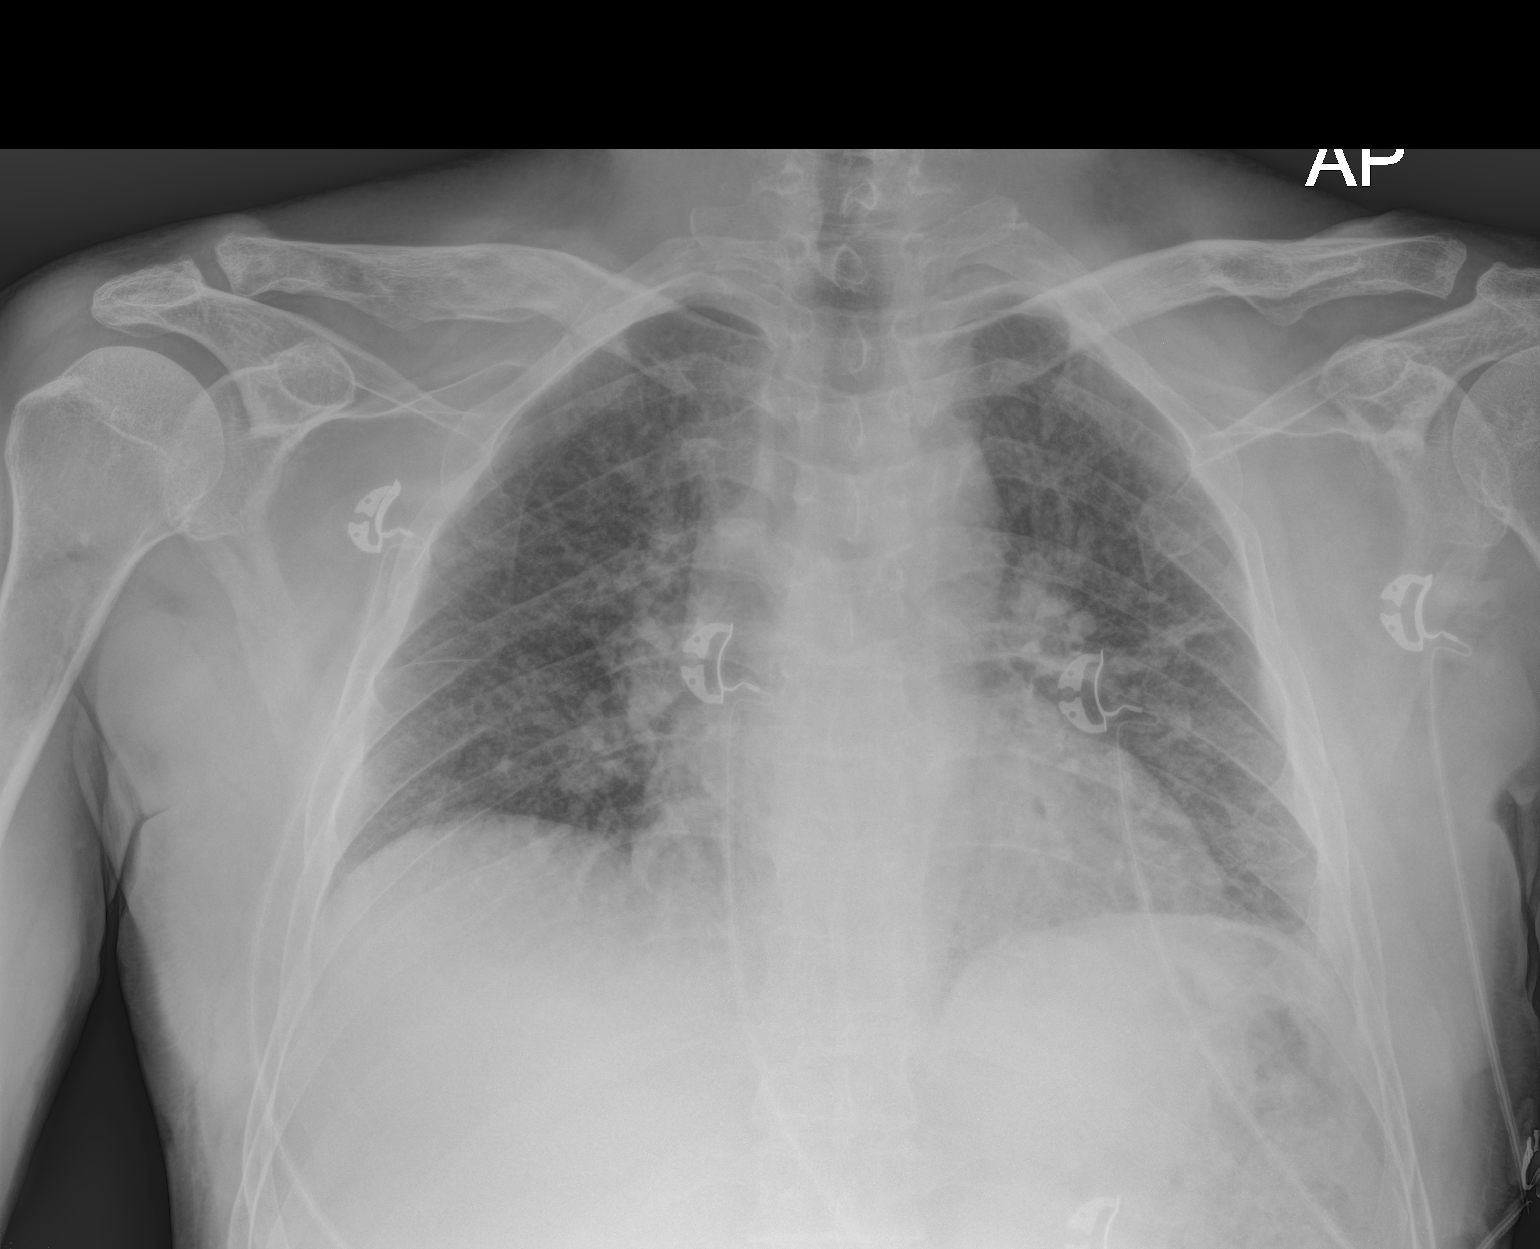

[1 of 1 positions shown; findings below may reference images not displayed]

FINDINGS: Stable cardiomediastinal silhouette. Central pulmonary vascular
congestion and probable bilateral perihilar edema is noted. No
pneumothorax or pleural effusion is noted. Bony thorax is
unremarkable. Minimal left midlung subsegmental atelectasis is
noted.
IMPRESSION: Central pulmonary vascular congestion and probable mild bilateral
perihilar edema is noted. Minimal left midlung subsegmental
atelectasis.
